# Patient Record
Sex: Female | Born: 1995 | Race: White | Hispanic: No | Marital: Single | Smoking: Former smoker
Health system: Southern US, Community
[De-identification: ages and names within clinical notes are randomized; demographics above are authoritative.]

## PROBLEM LIST (undated history)

## (undated) DIAGNOSIS — R87612 Low grade squamous intraepithelial lesion on cytologic smear of cervix (LGSIL): Secondary | ICD-10-CM

## (undated) DIAGNOSIS — E079 Disorder of thyroid, unspecified: Secondary | ICD-10-CM

## (undated) DIAGNOSIS — R42 Dizziness and giddiness: Secondary | ICD-10-CM

## (undated) DIAGNOSIS — F603 Borderline personality disorder: Secondary | ICD-10-CM

## (undated) HISTORY — DX: Low grade squamous intraepithelial lesion on cytologic smear of cervix (LGSIL): R87.612

## (undated) HISTORY — DX: Dizziness and giddiness: R42

## (undated) HISTORY — DX: Disorder of thyroid, unspecified: E07.9

---
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
## 2014-12-13 ENCOUNTER — Other Ambulatory Visit: Payer: Self-pay

## 2014-12-13 DIAGNOSIS — Z30019 Encounter for initial prescription of contraceptives, unspecified: Secondary | ICD-10-CM

## 2014-12-13 MED ORDER — ETONOGESTREL-ETHINYL ESTRADIOL 0.12-0.015 MG/24HR VA RING
VAGINAL_RING | VAGINAL | Status: DC
Start: 2014-12-13 — End: 2014-12-15

## 2014-12-15 ENCOUNTER — Other Ambulatory Visit: Payer: Self-pay

## 2014-12-15 DIAGNOSIS — Z30019 Encounter for initial prescription of contraceptives, unspecified: Secondary | ICD-10-CM

## 2014-12-15 MED ORDER — ETONOGESTREL-ETHINYL ESTRADIOL 0.12-0.015 MG/24HR VA RING: VAGINAL_RING | VAGINAL | Status: DC

## 2014-12-29 ENCOUNTER — Ambulatory Visit (INDEPENDENT_AMBULATORY_CARE_PROVIDER_SITE_OTHER): Payer: BLUE CROSS/BLUE SHIELD | Admitting: Women's Health

## 2014-12-29 ENCOUNTER — Encounter: Payer: Self-pay | Admitting: Women's Health

## 2014-12-29 VITALS — BP 115/80 | Ht 62.0 in | Wt 157.0 lb

## 2014-12-29 DIAGNOSIS — Z30019 Encounter for initial prescription of contraceptives, unspecified: Secondary | ICD-10-CM

## 2014-12-29 DIAGNOSIS — Z113 Encounter for screening for infections with a predominantly sexual mode of transmission: Secondary | ICD-10-CM | POA: Diagnosis not present

## 2014-12-29 LAB — URINALYSIS W MICROSCOPIC + REFLEX CULTURE
BILIRUBIN URINE: NEGATIVE
CASTS: NONE SEEN [LPF]
Crystals: NONE SEEN [HPF]
Glucose, UA: NEGATIVE
Ketones, ur: NEGATIVE
NITRITE: NEGATIVE
Protein, ur: NEGATIVE
YEAST: NONE SEEN [HPF]
pH: 6 (ref 5.0–8.0)

## 2014-12-29 MED ORDER — ETONOGESTREL-ETHINYL ESTRADIOL 0.12-0.015 MG/24HR VA RING: VAGINAL_RING | VAGINAL | Status: DC
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
## 2014-12-29 NOTE — Patient Instructions (Signed)

## 2014-12-30 LAB — GC/CHLAMYDIA PROBE AMP, URINE
CHLAMYDIA, SWAB/URINE, PCR: NEGATIVE
GC Probe Amp, Urine: NEGATIVE

## 2014-12-31 LAB — URINE CULTURE

## 2015-08-20 DIAGNOSIS — J029 Acute pharyngitis, unspecified: Secondary | ICD-10-CM | POA: Diagnosis not present

## 2016-01-03 ENCOUNTER — Encounter: Payer: BLUE CROSS/BLUE SHIELD | Admitting: Women's Health

## 2016-01-11 ENCOUNTER — Encounter: Payer: BLUE CROSS/BLUE SHIELD | Admitting: Women's Health

## 2016-02-07 ENCOUNTER — Other Ambulatory Visit: Payer: Self-pay

## 2016-02-07 DIAGNOSIS — Z30015 Encounter for initial prescription of vaginal ring hormonal contraceptive: Secondary | ICD-10-CM

## 2016-02-07 MED ORDER — ETONOGESTREL-ETHINYL ESTRADIOL 0.12-0.015 MG/24HR VA RING: VAGINAL_RING | VAGINAL | 0 refills | Status: DC

## 2016-03-06 ENCOUNTER — Encounter: Payer: BLUE CROSS/BLUE SHIELD | Admitting: Women's Health

## 2016-03-28 ENCOUNTER — Encounter: Payer: BLUE CROSS/BLUE SHIELD | Admitting: Women's Health

## 2016-05-06 ENCOUNTER — Other Ambulatory Visit: Payer: Self-pay

## 2016-05-06 DIAGNOSIS — Z30015 Encounter for initial prescription of vaginal ring hormonal contraceptive: Secondary | ICD-10-CM

## 2016-05-06 MED ORDER — ETONOGESTREL-ETHINYL ESTRADIOL 0.12-0.015 MG/24HR VA RING: VAGINAL_RING | VAGINAL | 0 refills | Status: DC

## 2016-05-17 ENCOUNTER — Encounter: Payer: BLUE CROSS/BLUE SHIELD | Admitting: Women's Health

## 2016-06-18 ENCOUNTER — Encounter: Payer: BLUE CROSS/BLUE SHIELD | Admitting: Women's Health

## 2016-06-25 ENCOUNTER — Ambulatory Visit (INDEPENDENT_AMBULATORY_CARE_PROVIDER_SITE_OTHER): Payer: BLUE CROSS/BLUE SHIELD | Admitting: Women's Health

## 2016-06-25 ENCOUNTER — Encounter: Payer: Self-pay | Admitting: Women's Health

## 2016-06-25 VITALS — BP 122/76 | Ht 62.0 in | Wt 176.0 lb

## 2016-06-25 DIAGNOSIS — Z01419 Encounter for gynecological examination (general) (routine) without abnormal findings: Secondary | ICD-10-CM

## 2016-06-25 DIAGNOSIS — Z30015 Encounter for initial prescription of vaginal ring hormonal contraceptive: Secondary | ICD-10-CM

## 2016-06-25 DIAGNOSIS — Z113 Encounter for screening for infections with a predominantly sexual mode of transmission: Secondary | ICD-10-CM

## 2016-06-25 LAB — URINALYSIS W MICROSCOPIC + REFLEX CULTURE
BILIRUBIN URINE: NEGATIVE
Casts: NONE SEEN [LPF]
Crystals: NONE SEEN [HPF]
Glucose, UA: NEGATIVE
KETONES UR: NEGATIVE
Nitrite: NEGATIVE
PH: 6 (ref 5.0–8.0)
Protein, ur: NEGATIVE
SPECIFIC GRAVITY, URINE: 1.025 (ref 1.001–1.035)
YEAST: NONE SEEN [HPF]

## 2016-06-25 MED ORDER — ETONOGESTREL-ETHINYL ESTRADIOL 0.12-0.015 MG/24HR VA RING: VAGINAL_RING | VAGINAL | 4 refills | Status: DC
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
## 2016-06-25 NOTE — Patient Instructions (Signed)
Living With Anxiety After being diagnosed with an anxiety disorder, you may be relieved to know why you have felt or behaved a certain way. It is natural to also feel overwhelmed about the treatment ahead and what it will mean for your life. With care and support, you can manage this condition and recover from it. How to cope with anxiety Dealing with stress  Stress is your body's reaction to life changes and events, both good and bad. Stress can last just a few hours or it can be ongoing. Stress can play a major role in anxiety, so it is important to learn both how to cope with stress and how to think about it differently. Talk with your health care provider or a counselor to learn more about stress reduction. He or she may suggest some stress reduction techniques, such as:  Music therapy. This can include creating or listening to music that you enjoy and that inspires you.  Mindfulness-based meditation. This involves being aware of your normal breaths, rather than trying to control your breathing. It can be done while sitting or walking.  Centering prayer. This is a kind of meditation that involves focusing on a word, phrase, or sacred image that is meaningful to you and that brings you peace.  Deep breathing. To do this, expand your stomach and inhale slowly through your nose. Hold your breath for 3-5 seconds. Then exhale slowly, allowing your stomach muscles to relax.  Self-talk. This is a skill where you identify thought patterns that lead to anxiety reactions and correct those thoughts.  Muscle relaxation. This involves tensing muscles then relaxing them. Choose a stress reduction technique that fits your lifestyle and personality. Stress reduction techniques take time and practice. Set aside 5-15 minutes a day to do them. Therapists can offer training in these techniques. The training may be covered by some insurance plans. Other things you can do to manage stress include:  Keeping a  stress diary. This can help you learn what triggers your stress and ways to control your response.  Thinking about how you respond to certain situations. You may not be able to control everything, but you can control your reaction.  Making time for activities that help you relax, and not feeling guilty about spending your time in this way. Therapy combined with coping and stress-reduction skills provides the best chance for successful treatment. Medicines  Medicines can help ease symptoms. Medicines for anxiety include:  Anti-anxiety drugs.  Antidepressants.  Beta-blockers. Medicines may be used as the main treatment for anxiety disorder, along with therapy, or if other treatments are not working. Medicines should be prescribed by a health care provider. Relationships  Relationships can play a big part in helping you recover. Try to spend more time connecting with trusted friends and family members. Consider going to couples counseling, taking family education classes, or going to family therapy. Therapy can help you and others better understand the condition. How to recognize changes in your condition Everyone has a different response to treatment for anxiety. Recovery from anxiety happens when symptoms decrease and stop interfering with your daily activities at home or work. This may mean that you will start to:  Have better concentration and focus.  Sleep better.  Be less irritable.  Have more energy.  Have improved memory. It is important to recognize when your condition is getting worse. Contact your health care provider if your symptoms interfere with home or work and you do not feel like your condition is  improving. Where to find help and support: You can get help and support from these sources:  Self-help groups.  Online and [employer].  A trusted spiritual leader.  Couples counseling.  Family education classes.  Family therapy. Follow these  instructions at home:  Eat a healthy diet that includes plenty of vegetables, fruits, whole grains, low-fat dairy products, and lean protein. Do not eat a lot of foods that are high in solid fats, added sugars, or salt.  Exercise. Most adults should do the following:  Exercise for at least 150 minutes each week. The exercise should increase your heart rate and make you sweat (moderate-intensity exercise).  Strengthening exercises at least twice a week.  Cut down on caffeine, tobacco, alcohol, and other potentially harmful substances.  Get the right amount and quality of sleep. Most adults need 7-9 hours of sleep each night.  Make choices that simplify your life.  Take over-the-counter and prescription medicines only as told by your health care provider.  Avoid caffeine, alcohol, and certain over-the-counter cold medicines. These may make you feel worse. Ask your pharmacist which medicines to avoid.  Keep all follow-up visits as told by your health care provider. This is important. Questions to ask your health care provider  Would I benefit from therapy?  How often should I follow up with a health care provider?  How long do I need to take medicine?  Are there any long-term side effects of my medicine?  Are there any alternatives to taking medicine? Contact a health care provider if:  You have a hard time staying focused or finishing daily tasks.  You spend many hours a day feeling worried about everyday life.  You become exhausted by worry.  You start to have headaches, feel tense, or have nausea.  You urinate more than normal.  You have diarrhea. Get help right away if:  You have a racing heart and shortness of breath.  You have thoughts of hurting yourself or others. If you ever feel like you may hurt yourself or others, or have thoughts about taking your own life, get help right away. You can go to your nearest emergency department or call:  Your local emergency  services (911 in the [place]  A suicide crisis helpline, such as the [facility] at (760)559-9699. This is open 24-hours a day. Summary  Taking steps to deal with stress can help calm you.  Medicines cannot cure anxiety disorders, but they can help ease symptoms.  Family, friends, and partners can play a big part in helping you recover from an anxiety disorder. This information is not intended to replace advice given to you by your health care provider. Make sure you discuss any questions you have with your health care provider. Document Released: 03/12/2016 Document Revised: 03/12/2016 Document Reviewed: 03/12/2016 Elsevier Interactive Patient Education  2017 [place] Maintenance, Female Adopting a healthy lifestyle and getting preventive care can go a long way to promote health and wellness. Talk with your health care provider about what schedule of regular examinations is right for you. This is a good chance for you to check in with your provider about disease prevention and staying healthy. In between checkups, there are plenty of things you can do on your own. Experts have done a lot of research about which lifestyle changes and preventive measures are most likely to keep you healthy. Ask your health care provider for more information. Weight and diet Eat a healthy diet  Be sure  to include plenty of vegetables, fruits, low-fat dairy products, and lean protein.  Do not eat a lot of foods high in solid fats, added sugars, or salt.  Get regular exercise. This is one of the most important things you can do for your health.  Most adults should exercise for at least 150 minutes each week. The exercise should increase your heart rate and make you sweat (moderate-intensity exercise).  Most adults should also do strengthening exercises at least twice a week. This is in addition to the moderate-intensity exercise. Maintain a healthy weight  Body mass  index (BMI) is a measurement that can be used to identify possible weight problems. It estimates body fat based on height and weight. Your health care provider can help determine your BMI and help you achieve or maintain a healthy weight.  For females 19 years of age and older:  A BMI below 18.5 is considered underweight.  A BMI of 18.5 to 24.9 is normal.  A BMI of 25 to 29.9 is considered overweight.  A BMI of 30 and above is considered obese. Watch levels of cholesterol and blood lipids  You should start having your blood tested for lipids and cholesterol at 22 years of age, then have this test every 5 years.  You may need to have your cholesterol levels checked more often if:  Your lipid or cholesterol levels are high.  You are older than 22 years of age.  You are at high risk for heart disease. Cancer screening Lung Cancer  Lung cancer screening is recommended for adults 64-36 years old who are at high risk for lung cancer because of a history of smoking.  A yearly low-dose CT scan of the lungs is recommended for people who:  Currently smoke.  Have quit within the past 15 years.  Have at least a 30-pack-year history of smoking. A pack year is smoking an average of one pack of cigarettes a day for 1 year.  Yearly screening should continue until it has been 15 years since you quit.  Yearly screening should stop if you develop a health problem that would prevent you from having lung cancer treatment. Breast Cancer  Practice breast self-awareness. This means understanding how your breasts normally appear and feel.  It also means doing regular breast self-exams. Let your health care provider know about any changes, no matter how small.  If you are in your 20s or 30s, you should have a clinical breast exam (CBE) by a health care provider every 1-3 years as part of a regular health exam.  If you are 75 or older, have a CBE every year. Also consider having a breast X-ray  (mammogram) every year.  If you have a family history of breast cancer, talk to your health care provider about genetic screening.  If you are at high risk for breast cancer, talk to your health care provider about having an MRI and a mammogram every year.  Breast cancer gene (BRCA) assessment is recommended for women who have family members with BRCA-related cancers. BRCA-related cancers include:  Breast.  Ovarian.  Tubal.  Peritoneal cancers.  Results of the assessment will determine the need for genetic counseling and BRCA1 and BRCA2 testing. Cervical Cancer  Your health care provider may recommend that you be screened regularly for cancer of the pelvic organs (ovaries, uterus, and vagina). This screening involves a pelvic examination, including checking for microscopic changes to the surface of your cervix (Pap test). You may be encouraged to  have this screening done every 3 years, beginning at age 50.  For women ages 24-65, health care providers may recommend pelvic exams and Pap testing every 3 years, or they may recommend the Pap and pelvic exam, combined with testing for human papilloma virus (HPV), every 5 years. Some types of HPV increase your risk of cervical cancer. Testing for HPV may also be done on women of any age with unclear Pap test results.  Other health care providers may not recommend any screening for nonpregnant women who are considered low risk for pelvic cancer and who do not have symptoms. Ask your health care provider if a screening pelvic exam is right for you.  If you have had past treatment for cervical cancer or a condition that could lead to cancer, you need Pap tests and screening for cancer for at least 20 years after your treatment. If Pap tests have been discontinued, your risk factors (such as having a new sexual partner) need to be reassessed to determine if screening should resume. Some women have medical problems that increase the chance of getting  cervical cancer. In these cases, your health care provider may recommend more frequent screening and Pap tests. Colorectal Cancer  This type of cancer can be detected and often prevented.  Routine colorectal cancer screening usually begins at 22 years of age and continues through 22 years of age.  Your health care provider may recommend screening at an earlier age if you have risk factors for colon cancer.  Your health care provider may also recommend using home test kits to check for hidden blood in the stool.  A small camera at the end of a tube can be used to examine your colon directly (sigmoidoscopy or colonoscopy). This is done to check for the earliest forms of colorectal cancer.  Routine screening usually begins at age 78.  Direct examination of the colon should be repeated every 5-10 years through 22 years of age. However, you may need to be screened more often if early forms of precancerous polyps or small growths are found. Skin Cancer  Check your skin from head to toe regularly.  Tell your health care provider about any new moles or changes in moles, especially if there is a change in a mole's shape or color.  Also tell your health care provider if you have a mole that is larger than the size of a pencil eraser.  Always use sunscreen. Apply sunscreen liberally and repeatedly throughout the day.  Protect yourself by wearing long sleeves, pants, a wide-brimmed hat, and sunglasses whenever you are outside. Heart disease, diabetes, and high blood pressure  High blood pressure causes heart disease and increases the risk of stroke. High blood pressure is more likely to develop in:  People who have blood pressure in the high end of the normal range (130-139/85-89 mm Hg).  People who are overweight or obese.  People who are African American.  If you are 20-25 years of age, have your blood pressure checked every 3-5 years. If you are 44 years of age or older, have your blood  pressure checked every year. You should have your blood pressure measured twice-once when you are at a hospital or clinic, and once when you are not at a hospital or clinic. Record the average of the two measurements. To check your blood pressure when you are not at a hospital or clinic, you can use:  An automated blood pressure machine at a pharmacy.  A home blood  pressure monitor.  If you are between 72 years and 68 years old, ask your health care provider if you should take aspirin to prevent strokes.  Have regular diabetes screenings. This involves taking a blood sample to check your fasting blood sugar level.  If you are at a normal weight and have a low risk for diabetes, have this test once every three years after 22 years of age.  If you are overweight and have a high risk for diabetes, consider being tested at a younger age or more often. Preventing infection Hepatitis B  If you have a higher risk for hepatitis B, you should be screened for this virus. You are considered at high risk for hepatitis B if:  You were born in a country where hepatitis B is common. Ask your health care provider which countries are considered high risk.  Your parents were born in a high-risk country, and you have not been immunized against hepatitis B (hepatitis B vaccine).  You have HIV or AIDS.  You use needles to inject street drugs.  You live with someone who has hepatitis B.  You have had sex with someone who has hepatitis B.  You get hemodialysis treatment.  You take certain medicines for conditions, including cancer, organ transplantation, and autoimmune conditions. Hepatitis C  Blood testing is recommended for:  Everyone born from [birth year] through 1966.  Anyone with known risk factors for hepatitis C. Sexually transmitted infections (STIs)  You should be screened for sexually transmitted infections (STIs) including gonorrhea and chlamydia if:  You are sexually active and are younger  than 22 years of age.  You are older than 22 years of age and your health care provider tells you that you are at risk for this type of infection.  Your sexual activity has changed since you were last screened and you are at an increased risk for chlamydia or gonorrhea. Ask your health care provider if you are at risk.  If you do not have HIV, but are at risk, it may be recommended that you take a prescription medicine daily to prevent HIV infection. This is called pre-exposure prophylaxis (PrEP). You are considered at risk if:  You are sexually active and do not regularly use condoms or know the HIV status of your partner(s).  You take drugs by injection.  You are sexually active with a partner who has HIV. Talk with your health care provider about whether you are at high risk of being infected with HIV. If you choose to begin PrEP, you should first be tested for HIV. You should then be tested every 3 months for as long as you are taking PrEP. Pregnancy  If you are premenopausal and you may become pregnant, ask your health care provider about preconception counseling.  If you may become pregnant, take 400 to 800 micrograms (mcg) of folic acid every day.  If you want to prevent pregnancy, talk to your health care provider about birth control (contraception). Osteoporosis and menopause  Osteoporosis is a disease in which the bones lose minerals and strength with aging. This can result in serious bone fractures. Your risk for osteoporosis can be identified using a bone density scan.  If you are 1 years of age or older, or if you are at risk for osteoporosis and fractures, ask your health care provider if you should be screened.  Ask your health care provider whether you should take a calcium or vitamin D supplement to lower your risk for osteoporosis.  Menopause may have certain physical symptoms and risks.  Hormone replacement therapy may reduce some of these symptoms and risks. Talk  to your health care provider about whether hormone replacement therapy is right for you. Follow these instructions at home:  Schedule regular health, dental, and eye exams.  Stay current with your immunizations.  Do not use any tobacco products including cigarettes, chewing tobacco, or electronic cigarettes.  If you are pregnant, do not drink alcohol.  If you are breastfeeding, limit how much and how often you drink alcohol.  Limit alcohol intake to no more than 1 drink per day for nonpregnant women. One drink equals 12 ounces of beer, 5 ounces of wine, or 1 ounces of hard liquor.  Do not use street drugs.  Do not share needles.  Ask your health care provider for help if you need support or information about quitting drugs.  Tell your health care provider if you often feel depressed.  Tell your health care provider if you have ever been abused or do not feel safe at home. This information is not intended to replace advice given to you by your health care provider. Make sure you discuss any questions you have with your health care provider. Document Released: 10/01/2010 Document Revised: 08/24/2015 Document Reviewed: 12/20/2014 Elsevier Interactive Patient Education  2017 [employer].

## 2016-06-26 LAB — GC/CHLAMYDIA PROBE AMP
CT Probe RNA: NOT DETECTED
GC PROBE AMP APTIMA: NOT DETECTED

## 2016-06-26 LAB — URINE CULTURE

## 2016-07-18 ENCOUNTER — Other Ambulatory Visit: Payer: Self-pay

## 2016-07-18 DIAGNOSIS — Z30015 Encounter for initial prescription of vaginal ring hormonal contraceptive: Secondary | ICD-10-CM

## 2016-07-18 MED ORDER — ETONOGESTREL-ETHINYL ESTRADIOL 0.12-0.015 MG/24HR VA RING: VAGINAL_RING | VAGINAL | 4 refills | Status: DC

## 2017-01-28 ENCOUNTER — Other Ambulatory Visit: Payer: Self-pay

## 2017-01-28 DIAGNOSIS — Z30015 Encounter for initial prescription of vaginal ring hormonal contraceptive: Secondary | ICD-10-CM

## 2017-01-28 MED ORDER — ETONOGESTREL-ETHINYL ESTRADIOL 0.12-0.015 MG/24HR VA RING: VAGINAL_RING | VAGINAL | 6 refills | Status: DC

## 2017-01-28 NOTE — Telephone Encounter (Signed)
Note from pharmacy received saying they need new Rx for patients Nuvaring as she is switching from mail order. Rx sent.

## 2017-03-18 DIAGNOSIS — B2790 Infectious mononucleosis, unspecified without complication: Secondary | ICD-10-CM | POA: Diagnosis not present

## 2017-03-18 DIAGNOSIS — J029 Acute pharyngitis, unspecified: Secondary | ICD-10-CM | POA: Diagnosis not present

## 2017-03-18 DIAGNOSIS — Z20828 Contact with and (suspected) exposure to other viral communicable diseases: Secondary | ICD-10-CM | POA: Diagnosis not present

## 2017-03-21 DIAGNOSIS — B085 Enteroviral vesicular pharyngitis: Secondary | ICD-10-CM | POA: Diagnosis not present

## 2017-04-09 DIAGNOSIS — F332 Major depressive disorder, recurrent severe without psychotic features: Secondary | ICD-10-CM | POA: Diagnosis not present

## 2017-04-16 DIAGNOSIS — F332 Major depressive disorder, recurrent severe without psychotic features: Secondary | ICD-10-CM | POA: Diagnosis not present

## 2017-05-07 DIAGNOSIS — F332 Major depressive disorder, recurrent severe without psychotic features: Secondary | ICD-10-CM | POA: Diagnosis not present

## 2017-05-28 DIAGNOSIS — F332 Major depressive disorder, recurrent severe without psychotic features: Secondary | ICD-10-CM | POA: Diagnosis not present

## 2017-05-29 DIAGNOSIS — J069 Acute upper respiratory infection, unspecified: Secondary | ICD-10-CM | POA: Diagnosis not present

## 2017-05-29 DIAGNOSIS — J209 Acute bronchitis, unspecified: Secondary | ICD-10-CM | POA: Diagnosis not present

## 2017-05-29 DIAGNOSIS — F332 Major depressive disorder, recurrent severe without psychotic features: Secondary | ICD-10-CM | POA: Diagnosis not present

## 2017-06-04 DIAGNOSIS — F332 Major depressive disorder, recurrent severe without psychotic features: Secondary | ICD-10-CM | POA: Diagnosis not present

## 2017-06-11 DIAGNOSIS — F332 Major depressive disorder, recurrent severe without psychotic features: Secondary | ICD-10-CM | POA: Diagnosis not present

## 2017-06-18 DIAGNOSIS — F332 Major depressive disorder, recurrent severe without psychotic features: Secondary | ICD-10-CM | POA: Diagnosis not present

## 2017-07-23 DIAGNOSIS — F332 Major depressive disorder, recurrent severe without psychotic features: Secondary | ICD-10-CM | POA: Diagnosis not present

## 2017-08-11 ENCOUNTER — Other Ambulatory Visit: Payer: Self-pay | Admitting: *Deleted

## 2017-08-11 DIAGNOSIS — Z30015 Encounter for initial prescription of vaginal ring hormonal contraceptive: Secondary | ICD-10-CM

## 2017-08-27 DIAGNOSIS — F332 Major depressive disorder, recurrent severe without psychotic features: Secondary | ICD-10-CM | POA: Diagnosis not present

## 2017-09-03 DIAGNOSIS — F332 Major depressive disorder, recurrent severe without psychotic features: Secondary | ICD-10-CM | POA: Diagnosis not present

## 2017-10-22 DIAGNOSIS — F332 Major depressive disorder, recurrent severe without psychotic features: Secondary | ICD-10-CM | POA: Diagnosis not present

## 2017-12-03 DIAGNOSIS — F332 Major depressive disorder, recurrent severe without psychotic features: Secondary | ICD-10-CM | POA: Diagnosis not present

## 2017-12-10 DIAGNOSIS — F332 Major depressive disorder, recurrent severe without psychotic features: Secondary | ICD-10-CM | POA: Diagnosis not present

## 2017-12-24 DIAGNOSIS — F332 Major depressive disorder, recurrent severe without psychotic features: Secondary | ICD-10-CM | POA: Diagnosis not present

## 2017-12-31 DIAGNOSIS — F332 Major depressive disorder, recurrent severe without psychotic features: Secondary | ICD-10-CM | POA: Diagnosis not present

## 2018-01-07 DIAGNOSIS — F332 Major depressive disorder, recurrent severe without psychotic features: Secondary | ICD-10-CM | POA: Diagnosis not present

## 2018-01-14 DIAGNOSIS — F332 Major depressive disorder, recurrent severe without psychotic features: Secondary | ICD-10-CM | POA: Diagnosis not present

## 2018-01-28 DIAGNOSIS — F332 Major depressive disorder, recurrent severe without psychotic features: Secondary | ICD-10-CM | POA: Diagnosis not present

## 2018-02-13 DIAGNOSIS — R300 Dysuria: Secondary | ICD-10-CM | POA: Diagnosis not present

## 2018-02-17 DIAGNOSIS — F332 Major depressive disorder, recurrent severe without psychotic features: Secondary | ICD-10-CM | POA: Diagnosis not present

## 2018-02-25 DIAGNOSIS — F332 Major depressive disorder, recurrent severe without psychotic features: Secondary | ICD-10-CM | POA: Diagnosis not present

## 2018-03-11 DIAGNOSIS — F332 Major depressive disorder, recurrent severe without psychotic features: Secondary | ICD-10-CM | POA: Diagnosis not present

## 2018-03-20 ENCOUNTER — Other Ambulatory Visit: Payer: Self-pay | Admitting: Women's Health

## 2018-03-20 DIAGNOSIS — Z30015 Encounter for initial prescription of vaginal ring hormonal contraceptive: Secondary | ICD-10-CM

## 2018-03-23 ENCOUNTER — Telehealth: Payer: Self-pay

## 2018-03-23 ENCOUNTER — Other Ambulatory Visit: Payer: Self-pay | Admitting: Women's Health

## 2018-03-23 DIAGNOSIS — Z30015 Encounter for initial prescription of vaginal ring hormonal contraceptive: Secondary | ICD-10-CM

## 2018-03-23 NOTE — Telephone Encounter (Signed)
Very overdue for CE with last one 06/25/2016.  [facility]    You are back up MD. Rip Harbourk to refill?

## 2018-03-23 NOTE — Telephone Encounter (Signed)
Spoke with patient and advised this. Rx refill sent.

## 2018-03-23 NOTE — Telephone Encounter (Signed)
Okay to refill through 05/06/2018.  Patient needs to understand if she misses that appointment no refills

## 2018-04-08 DIAGNOSIS — F332 Major depressive disorder, recurrent severe without psychotic features: Secondary | ICD-10-CM | POA: Diagnosis not present

## 2018-04-09 ENCOUNTER — Encounter (HOSPITAL_COMMUNITY): Payer: Self-pay | Admitting: Emergency Medicine

## 2018-04-09 ENCOUNTER — Emergency Department (HOSPITAL_COMMUNITY)
Admission: EM | Admit: 2018-04-09 | Discharge: 2018-04-10 | Disposition: A | Discharge status: Home / Self Care | Payer: BLUE CROSS/BLUE SHIELD | Attending: Emergency Medicine | Admitting: Emergency Medicine

## 2018-04-09 ENCOUNTER — Other Ambulatory Visit: Payer: Self-pay

## 2018-04-09 DIAGNOSIS — M545 Low back pain, unspecified: Secondary | ICD-10-CM

## 2018-04-09 DIAGNOSIS — F17200 Nicotine dependence, unspecified, uncomplicated: Secondary | ICD-10-CM | POA: Diagnosis not present

## 2018-04-09 DIAGNOSIS — Z79899 Other long term (current) drug therapy: Secondary | ICD-10-CM | POA: Diagnosis not present

## 2018-04-09 DIAGNOSIS — R109 Unspecified abdominal pain: Secondary | ICD-10-CM | POA: Diagnosis not present

## 2018-04-09 HISTORY — DX: Borderline personality disorder: F60.3

## 2018-04-09 LAB — URINALYSIS, ROUTINE W REFLEX MICROSCOPIC
Bilirubin Urine: NEGATIVE
Glucose, UA: NEGATIVE mg/dL
Hgb urine dipstick: NEGATIVE
KETONES UR: NEGATIVE mg/dL
Nitrite: NEGATIVE
Protein, ur: NEGATIVE mg/dL
Specific Gravity, Urine: 1.02 (ref 1.005–1.030)
pH: 6 (ref 5.0–8.0)

## 2018-04-09 LAB — POC URINE PREG, ED: PREG TEST UR: NEGATIVE

## 2018-04-09 NOTE — ED Triage Notes (Signed)
Patient c/o left side flank pain today. Recently finished abx treating pyelo. Denies other urinary sx. Denies N/V.

## 2018-04-10 ENCOUNTER — Emergency Department (HOSPITAL_COMMUNITY): Payer: BLUE CROSS/BLUE SHIELD

## 2018-04-10 DIAGNOSIS — R109 Unspecified abdominal pain: Secondary | ICD-10-CM | POA: Diagnosis not present

## 2018-04-10 MED ORDER — IBUPROFEN 600 MG PO TABS: 600.0000 mg | ORAL_TABLET | Freq: Four times a day (QID) | ORAL | 0 refills | Status: AC | PRN

## 2018-04-10 MED ORDER — OXYCODONE-ACETAMINOPHEN 5-325 MG PO TABS
1.0000 | ORAL_TABLET | Freq: Once | ORAL | Status: AC
  Administered 2018-04-10: 1 via ORAL
  Filled 2018-04-10: qty 1

## 2018-04-10 MED ORDER — CYCLOBENZAPRINE HCL 10 MG PO TABS: 10.0000 mg | ORAL_TABLET | Freq: Two times a day (BID) | ORAL | 0 refills | Status: DC | PRN

## 2018-04-10 MED ORDER — SODIUM CHLORIDE 0.9 % IV BOLUS: 1000.0000 mL | Freq: Once | INTRAVENOUS | Status: DC

## 2018-04-10 NOTE — ED Notes (Signed)
Patient transported to CT 

## 2018-04-10 NOTE — ED Provider Notes (Signed)
[facility]-EMERGENCY DEPT Provider Note   CSN: [id number] Arrival date & time: 04/09/18  2015     History   Chief Complaint Chief Complaint  Patient presents with  . Flank Pain    HPI Nina [name] is a 23 y.o. female.  Patient presents to ED with complaint of left flank pain that started 2-3 days ago. No fever, urinary symptoms (hematuria, dysuria), nausea or vomiting. The pain is constant with periodic spikes in intensity. She states that passing gas tends to relieve the pain but no other identified modifying factors. No abdominal pain, constipation, diarrhea.   The history is provided by the patient. No language interpreter was used.  Flank Pain  Pertinent negatives include no abdominal pain and no shortness of breath.    Past Medical History:  Diagnosis Date  . Borderline personality disorder (HCC)   . Thyroid disease   . Vertigo     Patient Active Problem List   Diagnosis Date Noted  . History of depression 12/07/2012    History reviewed. No pertinent surgical history.   OB History    Gravida  0   Para      Term      Preterm      AB      Living        SAB      TAB      Ectopic      Multiple      Live Births               Home Medications    Prior to Admission medications   Medication Sig Start Date End Date Taking? Authorizing Provider  amphetamine-dextroamphetamine (ADDERALL) 20 MG tablet Take 20 mg by mouth 2 (two) times daily.   Yes [provider]  divalproex (DEPAKOTE) 250 MG DR tablet Take 250 mg by mouth 2 (two) times daily.   Yes [provider]  escitalopram (LEXAPRO) 20 MG tablet Take 20 mg by mouth 2 (two) times daily.   Yes [provider]  etonogestrel-ethinyl estradiol (NUVARING) 0.12-0.015 MG/24HR vaginal ring INSERT 1 RING VAGINALLY AS DIRECTED. REMOVE AFTER 3 WEEKS & WAIT 7 DAYS BEFORE INSERTING A NEW RING Patient not taking: Reported on 04/10/2018 03/23/18   Fontaine,  Nadyne Coombes, MD    Family History Family History  Problem Relation Age of Onset  . Hypertension Paternal Grandmother   . Hypertension Paternal Grandfather     Social History Social History   Tobacco Use  . Smoking status: Current Some Day Smoker  . Smokeless tobacco: Never Used  Substance Use Topics  . Alcohol use: Yes    Alcohol/week: 0.0 standard drinks    Comment: occassional  . Drug use: Yes    Types: Marijuana    Comment: previous use     Allergies   Patient has no known allergies.   Review of Systems Review of Systems  Constitutional: Negative for chills and fever.  HENT: Negative.   Respiratory: Negative.  Negative for shortness of breath.   Cardiovascular: Negative.   Gastrointestinal: Negative.  Negative for abdominal pain, constipation, diarrhea and vomiting.  Genitourinary: Positive for flank pain. Negative for dysuria and hematuria.  Skin: Negative.   Neurological: Negative.      Physical Exam Updated Vital Signs BP 124/83 (BP Location: Left Arm)   Pulse 92   Temp 98.6 F (37 C) (Oral)   Resp 18   SpO2 100%   Physical Exam Vitals signs and nursing  note reviewed.  Constitutional:      Appearance: She is well-developed.     Comments: Uncomfortable in appearance.  HENT:     Head: Normocephalic.  Neck:     Musculoskeletal: Normal range of motion and neck supple.  Cardiovascular:     Rate and Rhythm: Normal rate and regular rhythm.  Pulmonary:     Effort: Pulmonary effort is normal.     Breath sounds: Normal breath sounds.  Abdominal:     General: Bowel sounds are normal.     Palpations: Abdomen is soft.     Tenderness: There is no abdominal tenderness. There is no guarding or rebound.  Musculoskeletal: Normal range of motion.       Back:     Comments: Tender to palpation  Skin:    General: Skin is warm and dry.     Findings: No rash.  Neurological:     Mental Status: She is alert and oriented to person, place, and time.      ED  Treatments / Results  Labs (all labs ordered are listed, but only abnormal results are displayed) Labs Reviewed  URINALYSIS, ROUTINE W REFLEX MICROSCOPIC - Abnormal; Notable for the following components:      Result Value   Leukocytes, UA TRACE (*)    Bacteria, UA RARE (*)    All other components within normal limits  POC URINE PREG, ED   Results for orders placed or performed during the hospital encounter of 04/09/18  Urinalysis, Routine w reflex microscopic- may I&O cath if menses  Result Value Ref Range   Color, Urine YELLOW YELLOW   APPearance CLEAR CLEAR   Specific Gravity, Urine 1.020 1.005 - 1.030   pH 6.0 5.0 - 8.0   Glucose, UA NEGATIVE NEGATIVE mg/dL   Hgb urine dipstick NEGATIVE NEGATIVE   Bilirubin Urine NEGATIVE NEGATIVE   Ketones, ur NEGATIVE NEGATIVE mg/dL   Protein, ur NEGATIVE NEGATIVE mg/dL   Nitrite NEGATIVE NEGATIVE   Leukocytes, UA TRACE (A) NEGATIVE   RBC / HPF 0-5 0 - 5 RBC/hpf   WBC, UA 0-5 0 - 5 WBC/hpf   Bacteria, UA RARE (A) NONE SEEN   Squamous Epithelial / LPF 0-5 0 - 5  POC urine preg, ED  Result Value Ref Range   Preg Test, Ur NEGATIVE NEGATIVE     EKG None  Radiology No results found.  Procedures Procedures (including critical care time)  Medications Ordered in ED Medications  oxyCODONE-acetaminophen (PERCOCET/ROXICET) 5-325 MG per tablet 1 tablet (has no administration in time range)     Initial Impression / Assessment and Plan / ED Course  I have reviewed the triage vital signs and the nursing notes.  Pertinent labs & imaging results that were available during my care of the patient were reviewed by me and considered in my medical decision making (see chart for details).     Patient to ED with left flank pain x 2-3 days. No fever, vomiting, diarrhea or constipation.   The patient was initially tachycardic to 120's. CBC, CMET ordered for further evaluation but the patient is uncomfortable with IV sticks and refused. CT renal  ordered to evaluate for stone. Discussed that, if negative, identification of the source of her pain would be difficult without blood studies.   Tachycardia significantly improved on recheck. Pain addressed. CT pending.  CT negative for acute findings. The patient is sleeping on re-exam. VSS. UA negative for infection. Most likely cause of pain is musculoskeletal. Will treat with  anti-inflammatories and muscle relaxer.   Final Clinical Impressions(s) / ED Diagnoses   Final diagnoses:  None   1. Back pain  ED Discharge Orders    None       Elpidio AnisUpstill, Jyoti Harju, PA-C 04/10/18 0358    Paula LibraMolpus, John, MD 04/10/18 [phone]

## 2018-04-10 NOTE — ED Notes (Signed)
Patient refused IV and blood work. EDP made aware of refusal.

## 2018-04-10 NOTE — ED Notes (Signed)
Pt refused lab draw from this NT

## 2018-04-10 NOTE — Discharge Instructions (Addendum)
Use warm compresses for pain. Take medications as prescribed. Follow up with your doctor if pain persists. Return here with worsening symptoms or new concern.

## 2018-04-15 DIAGNOSIS — F332 Major depressive disorder, recurrent severe without psychotic features: Secondary | ICD-10-CM | POA: Diagnosis not present

## 2018-04-22 DIAGNOSIS — F332 Major depressive disorder, recurrent severe without psychotic features: Secondary | ICD-10-CM | POA: Diagnosis not present

## 2018-04-29 DIAGNOSIS — F332 Major depressive disorder, recurrent severe without psychotic features: Secondary | ICD-10-CM | POA: Diagnosis not present

## 2018-05-02 DIAGNOSIS — R87612 Low grade squamous intraepithelial lesion on cytologic smear of cervix (LGSIL): Secondary | ICD-10-CM

## 2018-05-02 HISTORY — DX: Low grade squamous intraepithelial lesion on cytologic smear of cervix (LGSIL): R87.612

## 2018-05-06 ENCOUNTER — Encounter: Payer: Self-pay | Admitting: Women's Health

## 2018-05-06 ENCOUNTER — Ambulatory Visit: Payer: BLUE CROSS/BLUE SHIELD | Admitting: Women's Health

## 2018-05-06 VITALS — BP 124/78 | Ht 62.0 in | Wt 166.0 lb

## 2018-05-06 DIAGNOSIS — Z3044 Encounter for surveillance of vaginal ring hormonal contraceptive device: Secondary | ICD-10-CM | POA: Diagnosis not present

## 2018-05-06 DIAGNOSIS — Z113 Encounter for screening for infections with a predominantly sexual mode of transmission: Secondary | ICD-10-CM

## 2018-05-06 DIAGNOSIS — N760 Acute vaginitis: Secondary | ICD-10-CM

## 2018-05-06 DIAGNOSIS — Z01419 Encounter for gynecological examination (general) (routine) without abnormal findings: Secondary | ICD-10-CM | POA: Diagnosis not present

## 2018-05-06 DIAGNOSIS — Z8639 Personal history of other endocrine, nutritional and metabolic disease: Secondary | ICD-10-CM | POA: Diagnosis not present

## 2018-05-06 DIAGNOSIS — B9689 Other specified bacterial agents as the cause of diseases classified elsewhere: Secondary | ICD-10-CM

## 2018-05-06 DIAGNOSIS — N898 Other specified noninflammatory disorders of vagina: Secondary | ICD-10-CM | POA: Diagnosis not present

## 2018-05-06 DIAGNOSIS — R87610 Atypical squamous cells of undetermined significance on cytologic smear of cervix (ASC-US): Secondary | ICD-10-CM | POA: Diagnosis not present

## 2018-05-06 DIAGNOSIS — Z13220 Encounter for screening for lipoid disorders: Secondary | ICD-10-CM

## 2018-05-06 LAB — WET PREP FOR TRICH, YEAST, CLUE

## 2018-05-06 MED ORDER — METRONIDAZOLE 500 MG PO TABS: 500.0000 mg | ORAL_TABLET | Freq: Two times a day (BID) | ORAL | 0 refills | Status: DC

## 2018-05-06 MED ORDER — ETONOGESTREL-ETHINYL ESTRADIOL 0.12-0.015 MG/24HR VA RING: VAGINAL_RING | VAGINAL | 4 refills | Status: DC

## 2018-05-06 NOTE — Patient Instructions (Signed)
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
## 2018-05-07 LAB — C. TRACHOMATIS/N. GONORRHOEAE RNA
C. trachomatis RNA, TMA: NOT DETECTED
N. gonorrhoeae RNA, TMA: NOT DETECTED

## 2018-05-07 LAB — CBC WITH DIFFERENTIAL/PLATELET
Absolute Monocytes: 752 cells/uL (ref 200–950)
BASOS PCT: 0.2 %
Basophils Absolute: 8 cells/uL (ref 0–200)
Eosinophils Absolute: 21 cells/uL (ref 15–500)
Eosinophils Relative: 0.5 %
HCT: 41.5 % (ref 35.0–45.0)
Hemoglobin: 14.2 g/dL (ref 11.7–15.5)
Lymphs Abs: 1071 cells/uL (ref 850–3900)
MCH: 27.7 pg (ref 27.0–33.0)
MCHC: 34.2 g/dL (ref 32.0–36.0)
MCV: 81.1 fL (ref 80.0–100.0)
MPV: 11.3 fL (ref 7.5–12.5)
Monocytes Relative: 17.9 %
Neutro Abs: 2348 cells/uL (ref 1500–7800)
Neutrophils Relative %: 55.9 %
Platelets: 231 10*3/uL (ref 140–400)
RBC: 5.12 10*6/uL — ABNORMAL HIGH (ref 3.80–5.10)
RDW: 12.6 % (ref 11.0–15.0)
Total Lymphocyte: 25.5 %
WBC: 4.2 10*3/uL (ref 3.8–10.8)

## 2018-05-07 LAB — HEPATITIS B SURFACE ANTIGEN: Hepatitis B Surface Ag: NONREACTIVE

## 2018-05-07 LAB — RPR: RPR Ser Ql: NONREACTIVE

## 2018-05-07 LAB — TSH: TSH: 0.01 mIU/L — ABNORMAL LOW

## 2018-05-07 LAB — HIV ANTIBODY (ROUTINE TESTING W REFLEX): HIV 1&2 Ab, 4th Generation: NONREACTIVE

## 2018-05-07 LAB — HEPATITIS C ANTIBODY
Hepatitis C Ab: NONREACTIVE
SIGNAL TO CUT-OFF: 0.02 (ref <1.00)

## 2018-05-11 ENCOUNTER — Telehealth: Payer: Self-pay | Admitting: *Deleted

## 2018-05-11 DIAGNOSIS — E0590 Thyrotoxicosis, unspecified without thyrotoxic crisis or storm: Secondary | ICD-10-CM

## 2018-05-11 LAB — PAP IG W/ RFLX HPV ASCU

## 2018-05-11 LAB — HUMAN PAPILLOMAVIRUS, HIGH RISK: HPV DNA High Risk: DETECTED — AB

## 2018-05-11 NOTE — Telephone Encounter (Signed)
"  Per [place] TSH low which is hyperthyroidism. "  Referral placed at [facility] they will call to schedule.

## 2018-05-18 NOTE — Telephone Encounter (Signed)
patient scheduled on 05/20/18 @ 1:40pm

## 2018-05-19 DIAGNOSIS — F332 Major depressive disorder, recurrent severe without psychotic features: Secondary | ICD-10-CM | POA: Diagnosis not present

## 2018-05-20 ENCOUNTER — Ambulatory Visit: Payer: BLUE CROSS/BLUE SHIELD | Admitting: Internal Medicine

## 2018-05-20 ENCOUNTER — Encounter: Payer: Self-pay | Admitting: Internal Medicine

## 2018-05-20 VITALS — BP 124/88 | HR 110 | Resp 16 | Ht 62.0 in | Wt 166.0 lb

## 2018-05-20 DIAGNOSIS — E0590 Thyrotoxicosis, unspecified without thyrotoxic crisis or storm: Secondary | ICD-10-CM | POA: Diagnosis not present

## 2018-05-20 DIAGNOSIS — F332 Major depressive disorder, recurrent severe without psychotic features: Secondary | ICD-10-CM | POA: Diagnosis not present

## 2018-05-20 LAB — TSH: TSH: <0.01 u[IU]/mL — ABNORMAL LOW (ref 0.35–4.50)

## 2018-05-20 LAB — T4, FREE: Free T4: 2.58 ng/dL — ABNORMAL HIGH (ref 0.60–1.60)

## 2018-05-20 MED ORDER — ATENOLOL 25 MG PO TABS: 25.0000 mg | ORAL_TABLET | Freq: Every day | ORAL | 3 refills | Status: DC

## 2018-05-20 NOTE — Patient Instructions (Signed)
-   Please stop by the lab today , we will contact you with results - Please start Atenolol 25 mg daily,this is to slow down your heart.

## 2018-05-20 NOTE — Progress Notes (Signed)
Name: Nina [name]  MRN/ DOB: [id number], 06-Dec-1995    Age/ Sex: 23 y.o., female    PCP: Danie Binder   Reason for Endocrinology Evaluation: Hyperthyroidism     Date of Initial Endocrinology Evaluation: 05/20/2018     HPI: [name] is a 23 y.o. female with a past medical history of Graves' Disease, depression , borderline personality disorder, history of excessive alcohol intake and anxiety . The patient presented for initial endocrinology clinic visit on 05/20/2018 for consultative assistance with her hyperthyroidism.   She was diagnosed with Graves' Disease at the age of 34 , she was put on a B-blocker and something else that she took for 6 months and was lost to follow up until she went to have a routine check up during her Gyn visit in February, 2020 and was found to have a TSH of 0.01 uIU/mL    Takes Kelp , and vitamins.  Last time she used Kelp was 4 weeks ago.  She has lost 35 lbs in the past 5 month , she endorses Heat intolerance, diarrhea, jittery and anxiety. Denies palpitations, or local neck symptoms.  She stopped excessive daily alcohol drinking approximately a year ago, currently she drinks 1-2 drinks once or twice a week, she works at a bar.   She denies any local neck symptoms  Maternal and paternal grandmothers with thyroid disease    HISTORY:  Past Medical History:  Past Medical History:  Diagnosis Date  . Borderline personality disorder (HCC)   . Thyroid disease   . Vertigo     Past Surgical History: History reviewed. No pertinent surgical history.   Social History:  reports that she has been smoking. She has never used smokeless tobacco. She reports current alcohol use. She reports current drug use. Drug: Marijuana.  Family History: family history includes Hypertension in her paternal grandfather and paternal grandmother; Thyroid disease in her maternal grandmother and paternal grandmother.   HOME MEDICATIONS: Allergies as  of 05/20/2018      Reactions   Azithromycin Rash      Medication List       Accurate as of May 20, 2018  3:57 PM. Always use your most recent med list.        amphetamine-dextroamphetamine 20 MG tablet Commonly known as:  ADDERALL Take 20 mg by mouth 2 (two) times daily.   atenolol 25 MG tablet Commonly known as:  TENORMIN Take 1 tablet (25 mg total) by mouth daily.   clonazePAM 0.5 MG tablet Commonly known as:  KLONOPIN 0.5 mg.   divalproex 250 MG DR tablet Commonly known as:  DEPAKOTE Take 250 mg by mouth 2 (two) times daily.   escitalopram 20 MG tablet Commonly known as:  LEXAPRO Take 20 mg by mouth 2 (two) times daily.   etonogestrel-ethinyl estradiol 0.12-0.015 MG/24HR vaginal ring Commonly known as:  NUVARING INSERT 1 RING VAGINALLY AS DIRECTED. REMOVE AFTER 3 WEEKS & WAIT 7 DAYS BEFORE INSERTING A NEW RING   ibuprofen 600 MG tablet Commonly known as:  ADVIL,MOTRIN Take 1 tablet (600 mg total) by mouth every 6 (six) hours as needed.   metroNIDAZOLE 500 MG tablet Commonly known as:  FLAGYL Take 1 tablet (500 mg total) by mouth 2 (two) times daily.         REVIEW OF SYSTEMS: A comprehensive ROS was conducted with the patient and is negative except as per HPI and below:  Review of Systems  Constitutional: Negative for  fever and weight loss.  HENT: Negative for congestion and sore throat.   Eyes: Positive for pain. Negative for blurred vision.  Respiratory: Positive for cough. Negative for sputum production.   Cardiovascular: Negative for chest pain and palpitations.  Gastrointestinal: Positive for diarrhea and nausea.  Genitourinary: Positive for frequency.  Skin: Negative.   Neurological: Positive for tingling and tremors.  Endo/Heme/Allergies: Negative for polydipsia.  Psychiatric/Behavioral: Positive for depression. The patient is nervous/anxious.        OBJECTIVE:  VS: BP 124/88 (BP Location: Left Arm, Patient Position: Sitting, Cuff Size:  Normal)   Pulse (!) 110   Resp 16   Ht 5\' 2"  (1.575 m)   Wt 166 lb (75.3 kg)   LMP 04/30/2018 Comment: nuvaring  SpO2 99%   BMI 30.36 kg/m    Wt Readings from Last 3 Encounters:  05/20/18 166 lb (75.3 kg)  05/06/18 166 lb (75.3 kg)  06/25/16 176 lb (79.8 kg)     EXAM: General: Pt appears well and is in NAD  Hydration: Well-hydrated with moist mucous membranes and good skin turgor  Eyes: External eye exam normal with stare, and lid lag, no exophthalmos.  EOM intact.   Ears, Nose, Throat: Hearing: Grossly intact bilaterally Dental: Good dentition  Throat: Clear without mass, erythema or exudate  Neck: General: Supple without adenopathy. Thyroid: Thyroid enlarged on the right side (? Nodule) . No thyroid bruit.  Lungs: Clear with good BS bilat with no rales, rhonchi, or wheezes  Heart: Auscultation: RRR.  Abdomen: Normoactive bowel sounds, soft, nontender, without masses or organomegaly palpable  Extremities:  BL LE: No pretibial edema normal ROM and strength.  Skin: Hair: Texture and amount normal with gender appropriate distribution Skin Inspection: No rashes. Skin Palpation: Skin temperature, texture, and thickness normal to palpation  Neuro: Cranial nerves: II - XII grossly intact  Motor: Normal strength throughout DTRs: 2+ and symmetric in UE without delay in relaxation phase  Mental Status: Judgment, insight: Intact Orientation: Oriented to time, place, and person Mood and affect: No depression, anxiety, or agitation     DATA REVIEWED:    Results for Barnetta ChapelHAVAS, Lyniah M (MRN [id number]) as of 05/21/2018 11:10  Ref. Range 05/20/2018 14:37  TSH Latest Ref Range: 0.35 - 4.50 uIU/mL <0.01 (L)  Triiodothyronine (T3) Latest Ref Range: 76 - 181 ng/dL 756430 (H)  E3,PIRJ(JOACZYT4,Free(Direct) Latest Ref Range: 0.60 - 1.60 ng/dL 6.062.58 (H)    ASSESSMENT/PLAN/RECOMMENDATIONS:   1. Hyperthyroidism secondary to Graves' disease:   -Patient with multiple symptoms that could be attributed to her  thyroid -She denies any local symptoms -She has been diagnosed with Graves' disease approximately 6 years ago and was lost to follow-up. -We have discussed the effects of hypothyroidism in osteoporosis, increasing risk of bone fractures, and increased risk of cardiac arrhythmias. - We discussed that Graves' disease is a result of an autoimmune condition involving the thyroid.  - We discussed with pt the benefits of methimazole in the Tx of hyperthyroidism, as well as the possible side effects/complications of anti-thyroid drug Tx (specifically detailing the rare, but serious side effect of agranulocytosis). She was informed of need for regular thyroid function monitoring while on methimazole to ensure appropriate dosage without over-treatment. As well, we discussed the possible side effects of methimazole including the chance of rash, the small chance of liver irritation/juandice and the <=1 in 300-400 chance of sudden onset agranulocytosis.  We discussed importance of going to ED promptly (and stopping methimazole) if shewere to develop significant  fever with severe sore throat of other evidence of acute infection.     - We extensively discussed the various treatment options for hyperthyroidism and Graves disease including ablation therapy with radioactive iodine versus antithyroid drug treatment versus surgical therapy.  We recommended to the patient that we felt, at this time, that I-131 ablation therapy would be most optimal.  We discussed the various possible benefits versus side effects of the various therapies.  -  I carefully explained to the patient that one of the consequences of I-131 ablation treatment would likely be permanent hypothyroidism which would require long-term replacement therapy with LT4.   Medications : Start atenolol 25 mg daily Methimazole 20  Mg daily STOP Kelp   2.  Graves' disease:  - No extrathyroidal manifestations of Graves' disease.  Addendum : Spoke to the  patient on 05/21/18 at 11 am about results .     Follow-up in 6 weeks Labs in 3 weeks   Signed electronically by: Lyndle Herrlich, MD  [facility] Endocr[facility]  [facility] [address] [place] [place], [place] [address] Phone: [phone] FAX: [phone]   CC: [address] [place] [address] Phone: [phone] Fax: [phone]   Return to Endocrinology clinic as below: Future Appointments  Date Time Provider Department Center  05/26/2018  9:00 AM Fontaine, Nadyne Coombes, MD [facility]  06/09/2018 10:30 AM [facility] LAB [facility] None  06/30/2018  1:40 PM Alexia Dinger, Konrad Dolores, MD [facility] None  05/11/2019 11:30 AM Harrington Challenger, NP [facility]

## 2018-05-21 ENCOUNTER — Telehealth: Payer: Self-pay | Admitting: Internal Medicine

## 2018-05-21 MED ORDER — METHIMAZOLE 10 MG PO TABS: 20.0000 mg | ORAL_TABLET | Freq: Every day | ORAL | 3 refills | Status: DC

## 2018-05-21 NOTE — Telephone Encounter (Signed)
Left a message for her to call me back to discuss results.     Abby Raelyn Mora, MD  [facility] Endocrinology  [facility] [address] [place], [place] [address] Phone: [phone] FAX: [phone]

## 2018-05-26 ENCOUNTER — Ambulatory Visit: Payer: BLUE CROSS/BLUE SHIELD | Admitting: Gynecology

## 2018-05-26 ENCOUNTER — Encounter: Payer: Self-pay | Admitting: Gynecology

## 2018-05-26 VITALS — BP 130/78

## 2018-05-26 DIAGNOSIS — N870 Mild cervical dysplasia: Secondary | ICD-10-CM | POA: Diagnosis not present

## 2018-05-26 DIAGNOSIS — R87610 Atypical squamous cells of undetermined significance on cytologic smear of cervix (ASC-US): Secondary | ICD-10-CM | POA: Diagnosis not present

## 2018-05-26 DIAGNOSIS — R87810 Cervical high risk human papillomavirus (HPV) DNA test positive: Secondary | ICD-10-CM | POA: Diagnosis not present

## 2018-05-26 NOTE — Progress Notes (Signed)
    Nina [name] [birth year] [id number]        23 y.o.  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  No other previously recorded Pap smears in her chart.  Does not verbally have a history of abnormal Pap smears.  Past medical history,surgical history, problem list, medications, allergies, family history and social history were all reviewed and documented in the EPIC chart.  Directed ROS with pertinent positives and negatives documented in the history of present illness/assessment and plan.  Exam: Nina [name] assistant Vitals:   05/26/18 0905  BP: 130/78   General appearance:  Normal Abdomen soft nontender without masses guarding rebound Pelvic external BUS vagina normal.  Cervix normal.  Uterus normal size midline mobile nontender.  Adnexa without masses or tenderness.  Colposcopy performed after acetic acid cleanse is adequate with acetowhite change noted at 12 and 6:00 transformation zone.  Representative biopsy taken at 12:00.  ECC performed.  Patient tolerated well. Physical Exam  Genitourinary:      Assessment/Plan:  23 y.o. 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  Colposcopy shows acetowhite change at 12 and 6:00 transformation zones.  No punctations, mosaicism, or atypical vessels.  Representative biopsy taken at 12:00.  ECC performed.  The patient, mother and I discussed dysplasia, high-grade/low-grade, progression/regression and the HPV association.  Patient will follow-up for results.  Assuming low-grade then recommend follow-up Pap smear/HPV 1 year.  If otherwise then will triage based upon results.    Nina Lords MD, 10:03 AM 05/26/2018

## 2018-05-26 NOTE — Patient Instructions (Signed)
Office will call you with biopsy results 

## 2018-05-27 LAB — T3: T3, Total: 430 ng/dL — ABNORMAL HIGH (ref 76–181)

## 2018-05-27 LAB — TRAB (TSH RECEPTOR BINDING ANTIBODY): TRAB: 13.57 IU/L — AB (ref <=2.00)

## 2018-05-28 ENCOUNTER — Encounter: Payer: Self-pay | Admitting: Gynecology

## 2018-05-28 LAB — TISSUE PATH REPORT

## 2018-05-28 LAB — PATHOLOGY

## 2018-05-29 ENCOUNTER — Encounter: Payer: Self-pay | Admitting: Gynecology

## 2018-06-05 ENCOUNTER — Other Ambulatory Visit: Payer: Self-pay | Admitting: Internal Medicine

## 2018-06-05 DIAGNOSIS — E0590 Thyrotoxicosis, unspecified without thyrotoxic crisis or storm: Secondary | ICD-10-CM

## 2018-06-09 ENCOUNTER — Other Ambulatory Visit: Payer: BLUE CROSS/BLUE SHIELD

## 2018-06-10 DIAGNOSIS — F332 Major depressive disorder, recurrent severe without psychotic features: Secondary | ICD-10-CM | POA: Diagnosis not present

## 2018-06-17 DIAGNOSIS — F332 Major depressive disorder, recurrent severe without psychotic features: Secondary | ICD-10-CM | POA: Diagnosis not present

## 2018-06-24 DIAGNOSIS — F332 Major depressive disorder, recurrent severe without psychotic features: Secondary | ICD-10-CM | POA: Diagnosis not present

## 2018-06-30 ENCOUNTER — Ambulatory Visit: Payer: BLUE CROSS/BLUE SHIELD | Admitting: Internal Medicine

## 2018-07-01 DIAGNOSIS — F332 Major depressive disorder, recurrent severe without psychotic features: Secondary | ICD-10-CM | POA: Diagnosis not present

## 2018-07-06 DIAGNOSIS — F332 Major depressive disorder, recurrent severe without psychotic features: Secondary | ICD-10-CM | POA: Diagnosis not present

## 2018-08-10 ENCOUNTER — Other Ambulatory Visit: Payer: Self-pay | Admitting: Internal Medicine

## 2018-08-11 DIAGNOSIS — F332 Major depressive disorder, recurrent severe without psychotic features: Secondary | ICD-10-CM | POA: Diagnosis not present

## 2018-08-12 ENCOUNTER — Ambulatory Visit (INDEPENDENT_AMBULATORY_CARE_PROVIDER_SITE_OTHER): Payer: BLUE CROSS/BLUE SHIELD | Admitting: Internal Medicine

## 2018-08-12 ENCOUNTER — Other Ambulatory Visit: Payer: Self-pay

## 2018-08-12 ENCOUNTER — Encounter: Payer: Self-pay | Admitting: Internal Medicine

## 2018-08-12 DIAGNOSIS — E0500 Thyrotoxicosis with diffuse goiter without thyrotoxic crisis or storm: Secondary | ICD-10-CM | POA: Diagnosis not present

## 2018-08-12 DIAGNOSIS — E0590 Thyrotoxicosis, unspecified without thyrotoxic crisis or storm: Secondary | ICD-10-CM | POA: Diagnosis not present

## 2018-08-12 DIAGNOSIS — F332 Major depressive disorder, recurrent severe without psychotic features: Secondary | ICD-10-CM | POA: Diagnosis not present

## 2018-08-12 NOTE — Progress Notes (Signed)
Virtual Visit via Video Note  I connected with Nina [name] on 08/12/18 at  1:40 PM EDT by a video enabled telemedicine application and verified that I am speaking with the correct person using two identifiers.   I discussed the limitations of evaluation and management by telemedicine and the availability of in person appointments. The patient expressed understanding and agreed to proceed.  -Location of the patient : Home -Location of the provider : Office -The names of all persons participating in the telemedicine service : Pt and myself    Name: Nina [name]  MRN/ DOB: [id number], 11-May-1995    Age/ Sex: 23 y.o., female     PCP: Danie BinderNelson, Kristen M, PA-C   Reason for Endocrinology Evaluation: Hyperthyroidism     Initial Endocrinology Clinic Visit: 05/20/2018    PATIENT IDENTIFIER: Nina [name] is a 23 y.o., female with a past medical history of Graves' Disease, depression , borderline personality disorder, history of excessive alcohol intake and anxiety . She has followed with [facility] since 05/20/2018 for consultative assistance with management of her hyperthyroidism.   HISTORICAL SUMMARY: The patient was first diagnosed with Graves' Disease at the age of 23 , she was put on a B-blocker and something else that she took for 6 months and was lost to follow up until she went to have a routine check up during her Gyn visit in February, 2020 and was found to have a TSH of 0.01 uIU/mL   Methimazole was started in 05/2018  SUBJECTIVE:   During last visit (05/20/2018): She was started on Methimazole 20 mg and atenolol 25 mg daily   Today (08/12/2018):  Nina [name] is here for a virtual 2 month follow up on hyperthyroidism. Since her last visit, she has been furloughed from her work, she continues to have health insurance though her family but was unable to afford the co-pay for her meds, so she has not taken methimazole for weeks. She took it for one month #60 after I  saw her last . She does not she has taken it long enough to notice a difference in her symptoms except improvement in tremors.  Since being off she has noted worsening tremors, she also has worsening anxiety but has not taken anxiety meds as well.  She denies palpitations but has diarrhea. No weight loss.  Denies local neck symptoms.   Pt with blurry vision and allergy symptoms but no pain, irritation or double vision.    ROS:  As per HPI.   HISTORY:  Past Medical History:  Past Medical History:  Diagnosis Date  . Borderline personality disorder (HCC)   . LGSIL of cervix of undetermined significance 05/2018   On colposcopic biopsy  . Thyroid disease   . Vertigo      Past Surgical History: No past surgical history on file.  Social History:  reports that she has been smoking. She has never used smokeless tobacco. She reports current alcohol use. She reports current drug use. Drug: Marijuana. Family History:  Family History  Problem Relation Age of Onset  . Hypertension Paternal Grandmother   . Thyroid disease Paternal Grandmother   . Hypertension Paternal Grandfather   . Thyroid disease Maternal Grandmother       HOME MEDICATIONS: Allergies as of 08/12/2018      Reactions   Azithromycin Rash      Medication List       Accurate as of Aug 12, 2018 10:53 AM. If you have any  questions, ask your nurse or doctor.        amphetamine-dextroamphetamine 20 MG tablet Commonly known as:  ADDERALL Take 20 mg by mouth 2 (two) times daily.   atenolol 25 MG tablet Commonly known as:  TENORMIN Take 1 tablet (25 mg total) by mouth daily.   clonazePAM 0.5 MG tablet Commonly known as:  KLONOPIN 0.5 mg.   divalproex 250 MG DR tablet Commonly known as:  DEPAKOTE Take 250 mg by mouth 2 (two) times daily.   escitalopram 20 MG tablet Commonly known as:  LEXAPRO Take 20 mg by mouth 2 (two) times daily.   etonogestrel-ethinyl estradiol 0.12-0.015 MG/24HR vaginal ring  Commonly known as:  NuvaRing INSERT 1 RING VAGINALLY AS DIRECTED. REMOVE AFTER 3 WEEKS & WAIT 7 DAYS BEFORE INSERTING A NEW RING   ibuprofen 600 MG tablet Commonly known as:  ADVIL Take 1 tablet (600 mg total) by mouth every 6 (six) hours as needed.   methimazole 10 MG tablet Commonly known as:  TAPAZOLE Take 2 tablets (20 mg total) by mouth daily.   metroNIDAZOLE 500 MG tablet Commonly known as:  FLAGYL Take 1 tablet (500 mg total) by mouth 2 (two) times daily.          DATA REVIEWED: Results for Nina [name] (MRN [id number]) as of 08/12/2018 13:18  Ref. Range 05/20/2018 14:37  TSH Latest Ref Range: 0.35 - 4.50 uIU/mL <0.01 (L)  Triiodothyronine (T3) Latest Ref Range: 76 - 181 ng/dL 809 (H)  X8,PJAS(NKNLZJ) Latest Ref Range: 0.60 - 1.60 ng/dL 6.73 (H)    Results for Nina [name] (MRN [id number]) as of 08/12/2018 13:18  Ref. Range 05/20/2018 14:37  TRAB Latest Ref Range: <=2.00 IU/L 13.57 (H)    ASSESSMENT / PLAN / RECOMMENDATIONS:   1. Hyperthyroidism Secondary to Graves' Disease:   - She continues to be clinically hyperthyroid, she has not taken methimazole in a few weeks, but will pick it up today.  - Discussed the risk of cardiac arrhythmia and its consequences of uncontrolled hyperthyroidism.  - Discussed the importance of compliance with methimazole to facilitate remission.  - Denies local neck symptoms     Medications  Methimazole 20 mg daily  Atenolol 25 mg daily    2. Graves' Disease:   - No extrathyroidal manifestations of graves' disease   I discussed the assessment and treatment plan with the patient. The patient was provided an opportunity to ask questions and all were answered. The patient agreed with the plan and demonstrated an understanding of the instructions.   The patient was advised to call back or seek an in-person evaluation if the symptoms worsen or if the condition fails to improve as anticipated.  Labs in 4 weeks, pt will call to  schedule a 2 month office visit follow up   Signed electronically by: Lyndle Herrlich, MD  [facility] Endocrinology  [facility] [address] [place], [place] [address] Phone: [phone] FAX: [phone]   CC: Rosalva [address] [place] [address] Phone: [phone] Fax: [phone]   Return to Endocrinology clinic as below: Future Appointments  Date Time Provider Department Center  08/12/2018  1:40 PM Lior Cartelli, Konrad Dolores, MD [facility] None  05/11/2019 11:30 AM Harrington Challenger, NP [facility]

## 2018-08-27 DIAGNOSIS — F332 Major depressive disorder, recurrent severe without psychotic features: Secondary | ICD-10-CM | POA: Diagnosis not present

## 2018-10-12 DIAGNOSIS — F332 Major depressive disorder, recurrent severe without psychotic features: Secondary | ICD-10-CM | POA: Diagnosis not present

## 2018-10-13 DIAGNOSIS — F332 Major depressive disorder, recurrent severe without psychotic features: Secondary | ICD-10-CM | POA: Diagnosis not present

## 2018-11-07 ENCOUNTER — Other Ambulatory Visit: Payer: Self-pay | Admitting: Internal Medicine

## 2018-12-08 DIAGNOSIS — F332 Major depressive disorder, recurrent severe without psychotic features: Secondary | ICD-10-CM | POA: Diagnosis not present

## 2018-12-16 DIAGNOSIS — F332 Major depressive disorder, recurrent severe without psychotic features: Secondary | ICD-10-CM | POA: Diagnosis not present

## 2018-12-21 ENCOUNTER — Encounter: Payer: Self-pay | Admitting: Gynecology

## 2018-12-28 DIAGNOSIS — M79675 Pain in left toe(s): Secondary | ICD-10-CM | POA: Diagnosis not present

## 2019-01-26 DIAGNOSIS — Z20828 Contact with and (suspected) exposure to other viral communicable diseases: Secondary | ICD-10-CM | POA: Diagnosis not present

## 2019-02-07 ENCOUNTER — Other Ambulatory Visit: Payer: Self-pay | Admitting: Internal Medicine

## 2019-02-22 ENCOUNTER — Other Ambulatory Visit: Payer: Self-pay | Admitting: Internal Medicine

## 2019-03-08 DIAGNOSIS — F332 Major depressive disorder, recurrent severe without psychotic features: Secondary | ICD-10-CM | POA: Diagnosis not present

## 2019-04-23 DIAGNOSIS — F332 Major depressive disorder, recurrent severe without psychotic features: Secondary | ICD-10-CM | POA: Diagnosis not present

## 2019-04-26 ENCOUNTER — Other Ambulatory Visit: Payer: Self-pay

## 2019-04-28 ENCOUNTER — Other Ambulatory Visit: Payer: Self-pay

## 2019-04-28 ENCOUNTER — Encounter: Payer: Self-pay | Admitting: Women's Health

## 2019-04-28 ENCOUNTER — Ambulatory Visit (INDEPENDENT_AMBULATORY_CARE_PROVIDER_SITE_OTHER): Payer: BC Managed Care – PPO | Admitting: Women's Health

## 2019-04-28 VITALS — BP 120/78

## 2019-04-28 DIAGNOSIS — R222 Localized swelling, mass and lump, trunk: Secondary | ICD-10-CM

## 2019-04-28 NOTE — Progress Notes (Signed)
24 year old S WF G0 presents with complaint of questionable bulge in suprapubic area that she noticed after shaving which scared her.  Nontender, no change in bowel elimination.  Denies heavy lifting or change in exercise.  Denies vaginal discharge, urinary symptoms, abdominal pain, nausea, fever.  Monthly cycle on NuvaRing, same partner.  Hypothyroid on Tapazole per endocrinologist.  Exam: Appears well.  No apparent bulge in the suprapubic area in the groin with standing, sitting or bearing down.  Area of concern left mons pubis which has some fatty tissue, no erythema, nontender with palpation and no palpable mass, hernia.  Normal exam  Plan: Reviewed normality of exam, will watch at this time.  Reassurance given.

## 2019-05-10 ENCOUNTER — Other Ambulatory Visit: Payer: Self-pay

## 2019-05-11 ENCOUNTER — Encounter: Payer: BC Managed Care – PPO | Admitting: Women's Health

## 2019-05-13 DIAGNOSIS — R197 Diarrhea, unspecified: Secondary | ICD-10-CM | POA: Diagnosis not present

## 2019-05-13 DIAGNOSIS — R1110 Vomiting, unspecified: Secondary | ICD-10-CM | POA: Diagnosis not present

## 2019-05-17 DIAGNOSIS — Z0000 Encounter for general adult medical examination without abnormal findings: Secondary | ICD-10-CM | POA: Diagnosis not present

## 2019-05-17 DIAGNOSIS — E0500 Thyrotoxicosis with diffuse goiter without thyrotoxic crisis or storm: Secondary | ICD-10-CM | POA: Diagnosis not present

## 2019-05-17 DIAGNOSIS — R591 Generalized enlarged lymph nodes: Secondary | ICD-10-CM | POA: Diagnosis not present

## 2019-05-17 DIAGNOSIS — Z136 Encounter for screening for cardiovascular disorders: Secondary | ICD-10-CM | POA: Diagnosis not present

## 2019-05-21 DIAGNOSIS — F332 Major depressive disorder, recurrent severe without psychotic features: Secondary | ICD-10-CM | POA: Diagnosis not present

## 2019-05-24 DIAGNOSIS — R591 Generalized enlarged lymph nodes: Secondary | ICD-10-CM | POA: Diagnosis not present

## 2019-05-24 DIAGNOSIS — E0500 Thyrotoxicosis with diffuse goiter without thyrotoxic crisis or storm: Secondary | ICD-10-CM | POA: Diagnosis not present

## 2019-05-24 DIAGNOSIS — Z0000 Encounter for general adult medical examination without abnormal findings: Secondary | ICD-10-CM | POA: Diagnosis not present

## 2019-05-27 DIAGNOSIS — Z23 Encounter for immunization: Secondary | ICD-10-CM | POA: Diagnosis not present

## 2019-06-23 ENCOUNTER — Ambulatory Visit: Payer: BC Managed Care – PPO | Admitting: Women's Health

## 2019-06-23 ENCOUNTER — Other Ambulatory Visit: Payer: Self-pay

## 2019-06-23 ENCOUNTER — Encounter: Payer: Self-pay | Admitting: Women's Health

## 2019-06-23 VITALS — BP 118/80 | Ht 61.0 in | Wt 165.0 lb

## 2019-06-23 DIAGNOSIS — Z3044 Encounter for surveillance of vaginal ring hormonal contraceptive device: Secondary | ICD-10-CM | POA: Diagnosis not present

## 2019-06-23 DIAGNOSIS — R87610 Atypical squamous cells of undetermined significance on cytologic smear of cervix (ASC-US): Secondary | ICD-10-CM | POA: Diagnosis not present

## 2019-06-23 DIAGNOSIS — Z01419 Encounter for gynecological examination (general) (routine) without abnormal findings: Secondary | ICD-10-CM

## 2019-06-23 MED ORDER — ETONOGESTREL-ETHINYL ESTRADIOL 0.12-0.015 MG/24HR VA RING: VAGINAL_RING | VAGINAL | 4 refills | Status: AC

## 2019-06-23 NOTE — Patient Instructions (Signed)
MVI daily Health Maintenance, Female Adopting a healthy lifestyle and getting preventive care are important in promoting health and wellness. Ask your health care provider about:  The right schedule for you to have regular tests and exams.  Things you can do on your own to prevent diseases and keep yourself healthy. What should I know about diet, weight, and exercise? Eat a healthy diet   Eat a diet that includes plenty of vegetables, fruits, low-fat dairy products, and lean protein.  Do not eat a lot of foods that are high in solid fats, added sugars, or sodium. Maintain a healthy weight Body mass index (BMI) is used to identify weight problems. It estimates body fat based on height and weight. Your health care provider can help determine your BMI and help you achieve or maintain a healthy weight. Get regular exercise Get regular exercise. This is one of the most important things you can do for your health. Most adults should:  Exercise for at least 150 minutes each week. The exercise should increase your heart rate and make you sweat (moderate-intensity exercise).  Do strengthening exercises at least twice a week. This is in addition to the moderate-intensity exercise.  Spend less time sitting. Even light physical activity can be beneficial. Watch cholesterol and blood lipids Have your blood tested for lipids and cholesterol at 25 years of age, then have this test every 5 years. Have your cholesterol levels checked more often if:  Your lipid or cholesterol levels are high.  You are older than 25 years of age.  You are at high risk for heart disease. What should I know about cancer screening? Depending on your health history and family history, you may need to have cancer screening at various ages. This may include screening for:  Breast cancer.  Cervical cancer.  Colorectal cancer.  Skin cancer.  Lung cancer. What should I know about heart disease, diabetes, and high  blood pressure? Blood pressure and heart disease  High blood pressure causes heart disease and increases the risk of stroke. This is more likely to develop in people who have high blood pressure readings, are of African descent, or are overweight.  Have your blood pressure checked: ? Every 3-5 years if you are 18-39 years of age. ? Every year if you are 40 years old or older. Diabetes Have regular diabetes screenings. This checks your fasting blood sugar level. Have the screening done:  Once every three years after age 40 if you are at a normal weight and have a low risk for diabetes.  More often and at a younger age if you are overweight or have a high risk for diabetes. What should I know about preventing infection? Hepatitis B If you have a higher risk for hepatitis B, you should be screened for this virus. Talk with your health care provider to find out if you are at risk for hepatitis B infection. Hepatitis C Testing is recommended for:  Everyone born from 1945 through 1965.  Anyone with known risk factors for hepatitis C. Sexually transmitted infections (STIs)  Get screened for STIs, including gonorrhea and chlamydia, if: ? You are sexually active and are younger than 24 years of age. ? You are older than 24 years of age and your health care provider tells you that you are at risk for this type of infection. ? Your sexual activity has changed since you were last screened, and you are at increased risk for chlamydia or gonorrhea. Ask your health care   provider if you are at risk.  Ask your health care provider about whether you are at high risk for HIV. Your health care provider may recommend a prescription medicine to help prevent HIV infection. If you choose to take medicine to prevent HIV, you should first get tested for HIV. You should then be tested every 3 months for as long as you are taking the medicine. Pregnancy  If you are about to stop having your period  (premenopausal) and you may become pregnant, seek counseling before you get pregnant.  Take 400 to 800 micrograms (mcg) of folic acid every day if you become pregnant.  Ask for birth control (contraception) if you want to prevent pregnancy. Osteoporosis and menopause Osteoporosis is a disease in which the bones lose minerals and strength with aging. This can result in bone fractures. If you are 49 years old or older, or if you are at risk for osteoporosis and fractures, ask your health care provider if you should:  Be screened for bone loss.  Take a calcium or vitamin D supplement to lower your risk of fractures.  Be given hormone replacement therapy (HRT) to treat symptoms of menopause. Follow these instructions at home: Lifestyle  Do not use any products that contain nicotine or tobacco, such as cigarettes, e-cigarettes, and chewing tobacco. If you need help quitting, ask your health care provider.  Do not use street drugs.  Do not share needles.  Ask your health care provider for help if you need support or information about quitting drugs. Alcohol use  Do not drink alcohol if: ? Your health care provider tells you not to drink. ? You are pregnant, may be pregnant, or are planning to become pregnant.  If you drink alcohol: ? Limit how much you use to 0-1 drink a day. ? Limit intake if you are breastfeeding.  Be aware of how much alcohol is in your drink. In the [place] one drink equals one 12 oz bottle of beer (355 mL), one 5 oz glass of wine (148 mL), or one 1 oz glass of hard liquor (44 mL). General instructions  Schedule regular health, dental, and eye exams.  Stay current with your vaccines.  Tell your health care provider if: ? You often feel depressed. ? You have ever been abused or do not feel safe at home. Summary  Adopting a healthy lifestyle and getting preventive care are important in promoting health and wellness.  Follow your health care provider's  instructions about healthy diet, exercising, and getting tested or screened for diseases.  Follow your health care provider's instructions on monitoring your cholesterol and blood pressure. This information is not intended to replace advice given to you by your health care provider. Make sure you discuss any questions you have with your health care provider. Document Revised: 03/11/2018 Document Reviewed: 03/11/2018 Elsevier Patient Education  2020 [employer].

## 2019-06-23 NOTE — Addendum Note (Signed)
Addended by: Tito Dine on: 06/23/2019 11:15 AM   Modules accepted: Orders
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
## 2019-06-30 LAB — PAP IG W/ RFLX HPV ASCU

## 2019-06-30 LAB — HUMAN PAPILLOMAVIRUS, HIGH RISK: HPV DNA High Risk: NOT DETECTED

## 2019-08-10 DIAGNOSIS — F332 Major depressive disorder, recurrent severe without psychotic features: Secondary | ICD-10-CM | POA: Diagnosis not present

## 2019-08-11 DIAGNOSIS — F332 Major depressive disorder, recurrent severe without psychotic features: Secondary | ICD-10-CM | POA: Diagnosis not present

## 2019-08-16 ENCOUNTER — Other Ambulatory Visit: Payer: Self-pay

## 2019-08-17 NOTE — Progress Notes (Deleted)
Name: Nina [name]  MRN/ DOB: [id number], Jan 08, 1996    Age/ Sex: 24 y.o., female     PCP: Patient, No Pcp Per   Reason for Endocrinology Evaluation: Hyperthyroidism     Initial Endocrinology Clinic Visit: 05/20/2018    PATIENT IDENTIFIER: Nina [name] is a 24 y.o., female with a past medical history of Graves' disease. She has followed with [facility] since 05/21/2019 for consultative assistance with management of her hyperthyroidism.   HISTORICAL SUMMARY: The patient was first diagnosed with Graves' Disease at the age of 51 , she was put on a B-blocker and something else that she took for 6 months and was lost to follow up until she went to have a routine check up during her Gyn visit in February, 2020 and was found to have a TSH of 0.01 uIU/mL    Methimazole started 05/2018  Maternal and paternal grandmothers with thyroid disease   SUBJECTIVE:   During last visit (08/12/2018): This was a virtual visit, she was maintained on methimazole   Today (08/18/2019):  Ms. [name] is here for a follow up on hyperthyroidism, she has not been to our office in a year.    ROS: As per HPI.   HISTORY:  Past Medical History:  Past Medical History:  Diagnosis Date  . Borderline personality disorder (HCC)   . LGSIL of cervix of undetermined significance 05/2018   On colposcopic biopsy  . Thyroid disease   . Vertigo     Past Surgical History: No past surgical history on file.  Social History:  reports that she has quit smoking. Her smoking use included cigarettes and e-cigarettes. She has never used smokeless tobacco. She reports current alcohol use. She reports current drug use. Drug: Marijuana. Family History:  Family History  Problem Relation Age of Onset  . Hypertension Paternal Grandmother   . Thyroid disease Paternal Grandmother   . Hypertension Paternal Grandfather   . Thyroid disease Maternal Grandmother      HOME MEDICATIONS: Allergies as of  08/18/2019      Reactions   Azithromycin Rash      Medication List       Accurate as of Aug 18, 2019  7:27 AM. If you have any questions, ask your nurse or doctor.        amphetamine-dextroamphetamine 20 MG tablet Commonly known as: ADDERALL Take 20 mg by mouth 2 (two) times daily.   atenolol 25 MG tablet Commonly known as: TENORMIN Take 1 tablet (25 mg total) by mouth daily. WILL PROVIDE 30 DAY SUPPLY. MUST CALL OFFICE TO SCHEDULE APPT   clonazePAM 0.5 MG tablet Commonly known as: KLONOPIN 0.5 mg.   divalproex 250 MG DR tablet Commonly known as: DEPAKOTE Take 250 mg by mouth 2 (two) times daily.   etonogestrel-ethinyl estradiol 0.12-0.015 MG/24HR vaginal ring Commonly known as: NuvaRing INSERT 1 RING VAGINALLY AS DIRECTED. REMOVE AFTER 3 WEEKS & WAIT 7 DAYS BEFORE INSERTING A NEW RING   ibuprofen 600 MG tablet Commonly known as: ADVIL Take 1 tablet (600 mg total) by mouth every 6 (six) hours as needed.   methimazole 10 MG tablet Commonly known as: TAPAZOLE Take 2 tablets (20 mg total) by mouth daily. WILL PROVIDE 30 DAY SUPPLY. MUST CALL OFFICE TO SCHEDULE APPT         OBJECTIVE:   PHYSICAL EXAM: VS: There were no vitals taken for this visit.   EXAM: General: Pt appears well and is in NAD  Hydration: Well-hydrated  with moist mucous membranes and good skin turgor  Eyes: External eye exam normal without stare, lid lag or exophthalmos.  EOM intact.  PERRL.  Ears, Nose, Throat: Hearing: Grossly intact bilaterally Dental: Good dentition  Throat: Clear without mass, erythema or exudate  Neck: General: Supple without adenopathy. Thyroid: Thyroid size normal.  No goiter or nodules appreciated. No thyroid bruit.  Lungs: Clear with good BS bilat with no rales, rhonchi, or wheezes  Heart: Auscultation: RRR.  Abdomen: Normoactive bowel sounds, soft, nontender, without masses or organomegaly palpable  Extremities: Gait and station: Normal gait  Digits and nails: No  clubbing, cyanosis, petechiae, or nodes Head and neck: Normal alignment and mobility BL UE: Normal ROM and strength. BL LE: No pretibial edema normal ROM and strength.  Skin: Hair: Texture and amount normal with gender appropriate distribution Skin Inspection: No rashes, acanthosis nigricans/skin tags. No lipohypertrophy Skin Palpation: Skin temperature, texture, and thickness normal to palpation  Neuro: Cranial nerves: II - XII grossly intact  Cerebellar: Normal coordination and movement; no tremor Motor: Normal strength throughout DTRs: 2+ and symmetric in UE without delay in relaxation phase  Mental Status: Judgment, insight: Intact Orientation: Oriented to time, place, and person Memory: Intact for recent and remote events Mood and affect: No depression, anxiety, or agitation     DATA REVIEWED: ***    ASSESSMENT / PLAN / RECOMMENDATIONS:   1. ***  Plan:  ***    Medications   ***   Signed electronically by: Mack Guise, MD  [facility] Endocrinology  [address] [place], [place] [address] Phone: [phone] FAX: [phone]      CC: Patient, No Pcp Per No address on file Phone: None  Fax: None   Return to Endocrinology clinic as below: Future Appointments  Date Time Provider [place]  08/18/2019  8:30 AM Jessika Rothery, Melanie Crazier, MD [facility] None

## 2019-08-18 ENCOUNTER — Telehealth: Payer: Self-pay | Admitting: Internal Medicine

## 2019-08-18 ENCOUNTER — Ambulatory Visit: Payer: BLUE CROSS/BLUE SHIELD | Admitting: Internal Medicine

## 2019-08-18 NOTE — Telephone Encounter (Signed)
Pt rescheduled appt she had today to 08/25/19

## 2019-08-18 NOTE — Telephone Encounter (Signed)
Patient called re: Patient was in [place] and went to Urgent Care. Her blood pressure was 162/90. Once patient returned to [facility] she had bp checked again and it was 133/88. Patient was told by the Nurse to notify Dr. Lonzo Cloud and her PCP about her high blood pressure.

## 2019-08-23 ENCOUNTER — Other Ambulatory Visit: Payer: Self-pay

## 2019-08-25 ENCOUNTER — Encounter: Payer: Self-pay | Admitting: Internal Medicine

## 2019-08-25 ENCOUNTER — Ambulatory Visit: Payer: BC Managed Care – PPO | Admitting: Internal Medicine

## 2019-08-25 ENCOUNTER — Other Ambulatory Visit: Payer: Self-pay

## 2019-08-25 VITALS — BP 120/80 | HR 78 | Temp 98.1°F | Ht 61.0 in | Wt 158.6 lb

## 2019-08-25 DIAGNOSIS — E0500 Thyrotoxicosis with diffuse goiter without thyrotoxic crisis or storm: Secondary | ICD-10-CM | POA: Diagnosis not present

## 2019-08-25 NOTE — Progress Notes (Signed)
Name: Nina [name]  MRN/ DOB: [id number], Aug 16, 1995    Age/ Sex: 24 y.o., female     PCP: Patient, No Pcp Per   Reason for Endocrinology Evaluation: Hyperthyroidism     Initial Endocrinology Clinic Visit: 05/20/2018    PATIENT IDENTIFIER: Nina [name] is a 24 y.o., female with a past medical history of Graves' disease. She has followed with [facility] since 05/21/2019 for consultative assistance with management of her hyperthyroidism.   HISTORICAL SUMMARY: The patient was first diagnosed with Graves' Disease at the age of 79 , she was put on a B-blocker and something else that she took for 6 months and was lost to follow up until she went to have a routine check up during her Gyn visit in February, 2020 and was found to have a TSH of 0.01 uIU/mL    Methimazole started 05/2018  Maternal and paternal grandmothers with thyroid disease   SUBJECTIVE:   During last visit (08/12/2018): This was a virtual visit, she was maintained on methimazole   Today (08/27/2019):  Nina [name] is here for a follow up on hyperthyroidism, she has not been to our office in a year.  She has been off methimazole for months.    For the past 5 months has had intentional weight loss  Has occasional diarrhea, heat intolerance has improved Has also noted improved tremors and anxiety     ROS: As per HPI.   HISTORY:  Past Medical History:  Past Medical History:  Diagnosis Date  . Borderline personality disorder ([place])   . LGSIL of cervix of undetermined significance 05/2018   On colposcopic biopsy  . Thyroid disease   . Vertigo     Past Surgical History: No past surgical history on file.  Social History:  reports that she has quit smoking. Her smoking use included cigarettes and e-cigarettes. She has never used smokeless tobacco. She reports current alcohol use. She reports current drug use. Drug: Marijuana. Family History:  Family History  Problem Relation Age of Onset  .  Hypertension Paternal Grandmother   . Thyroid disease Paternal Grandmother   . Hypertension Paternal Grandfather   . Thyroid disease Maternal Grandmother      HOME MEDICATIONS: Allergies as of 08/25/2019      Reactions   Azithromycin Rash      Medication List       Accurate as of Aug 25, 2019 11:59 PM. If you have any questions, ask your nurse or doctor.        amphetamine-dextroamphetamine 20 MG tablet Commonly known as: ADDERALL Take 20 mg by mouth 2 (two) times daily.   atenolol 25 MG tablet Commonly known as: TENORMIN Take 1 tablet (25 mg total) by mouth daily. WILL PROVIDE 30 DAY SUPPLY. MUST CALL OFFICE TO SCHEDULE APPT   clonazePAM 0.5 MG tablet Commonly known as: KLONOPIN 0.5 mg.   divalproex 250 MG DR tablet Commonly known as: DEPAKOTE Take 250 mg by mouth 2 (two) times daily.   etonogestrel-ethinyl estradiol 0.12-0.015 MG/24HR vaginal ring Commonly known as: NuvaRing INSERT 1 RING VAGINALLY AS DIRECTED. REMOVE AFTER 3 WEEKS & WAIT 7 DAYS BEFORE INSERTING A NEW RING   ibuprofen 600 MG tablet Commonly known as: ADVIL Take 1 tablet (600 mg total) by mouth every 6 (six) hours as needed.   methimazole 10 MG tablet Commonly known as: TAPAZOLE Take 2 tablets (20 mg total) by mouth daily. WILL PROVIDE 30 DAY SUPPLY. MUST CALL OFFICE TO SCHEDULE APPT  OBJECTIVE:   PHYSICAL EXAM: VS: BP 120/80 (BP Location: Left Arm, Patient Position: Sitting, Cuff Size: Large)   Pulse 78   Temp 98.1 F (36.7 C)   Ht 5\' 1"  (1.549 m)   Wt 158 lb 9.6 oz (71.9 kg)   LMP 08/17/2019 (Exact Date)   SpO2 99%   BMI 29.97 kg/m    EXAM: General: Pt appears well and is in NAD  Neck: General: Supple without adenopathy. Thyroid: Thyroid size normal.  No goiter or nodules appreciated. No thyroid bruit.  Lungs: Clear with good BS bilat with no rales, rhonchi, or wheezes  Heart: Auscultation: RRR.  Abdomen: Normoactive bowel sounds, soft, nontender, without masses or  organomegaly palpable  Extremities:  BL LE: No pretibial edema normal ROM and strength.  Skin: Hair: Texture and amount normal with gender appropriate distribution Skin Inspection: No rashes Skin Palpation: Skin temperature, texture, and thickness normal to palpation  Mental Status: Judgment, insight: Intact Orientation: Oriented to time, place, and person Memory: Intact for recent and remote events Mood and affect: No depression, anxiety, or agitation     DATA REVIEWED:  Results for Nina [name], Nina [name] (MRN Barnetta Chapel) as of 08/27/2019 13:50  Ref. Range 08/27/2019 08:52  TSH Latest Ref Range: 0.35 - 4.50 uIU/mL <0.01 (L)  T4,Free(Direct) Latest Ref Range: 0.60 - 1.60 ng/dL 08/29/2019     ASSESSMENT / PLAN / RECOMMENDATIONS:   1. Hyperthyroidism Secondary to Graves' Disease:   - She is clinically euthyroid - No local neck symptoms - She has not been on methimazole in months, given suppressed TSH on today's labs will restart methimazole as below   Medications  Methimazole 5 mg daily     2. Graves' Disease:   - No extrathyroidal manifestations of graves' disease    F/u in 3 months  Signed electronically by: 1.88, MD  [facility]  [facility] [address] [place] [place], [place] Phone: [phone] FAX: [phone]      CC: Patient, No Pcp Per No address on file Phone: None  Fax: None   Return to Endocrinology clinic as below: Future Appointments  Date Time Provider Department Center  11/29/2019  9:10 AM Ahjanae Cassel, 12/01/2019, MD [facility] None

## 2019-08-25 NOTE — Patient Instructions (Signed)
-   Please stop by the lab today  

## 2019-08-26 ENCOUNTER — Other Ambulatory Visit: Payer: BC Managed Care – PPO

## 2019-08-27 ENCOUNTER — Other Ambulatory Visit: Payer: Self-pay

## 2019-08-27 ENCOUNTER — Other Ambulatory Visit (INDEPENDENT_AMBULATORY_CARE_PROVIDER_SITE_OTHER): Payer: BC Managed Care – PPO

## 2019-08-27 DIAGNOSIS — E0500 Thyrotoxicosis with diffuse goiter without thyrotoxic crisis or storm: Secondary | ICD-10-CM

## 2019-08-27 LAB — TSH: TSH: <0.01 u[IU]/mL — ABNORMAL LOW (ref 0.35–4.50)

## 2019-08-27 LAB — T4, FREE: Free T4: 1.2 ng/dL (ref 0.60–1.60)

## 2019-08-27 MED ORDER — METHIMAZOLE 5 MG PO TABS: 5.0000 mg | ORAL_TABLET | Freq: Every day | ORAL | 1 refills | Status: DC

## 2019-08-28 LAB — T3: T3, Total: 202 ng/dL — ABNORMAL HIGH (ref 76–181)

## 2019-09-23 ENCOUNTER — Telehealth: Payer: Self-pay | Admitting: Internal Medicine

## 2019-09-23 DIAGNOSIS — R350 Frequency of micturition: Secondary | ICD-10-CM | POA: Diagnosis not present

## 2019-09-23 DIAGNOSIS — J029 Acute pharyngitis, unspecified: Secondary | ICD-10-CM | POA: Diagnosis not present

## 2019-09-23 NOTE — Telephone Encounter (Signed)
Medication Refill Request  Did you call your pharmacy and request this refill first? Yes  . If patient has not contacted pharmacy first, instruct them to do so for future refills.  . Remind them that contacting the pharmacy for their refill is the quickest method to get the refill.  . Refill policy also stated that it will take anywhere between 24-72 hours to receive the refill.    Name of medication? Methimazole - patient said she never got it and asked if we could re send  Is this a 90 day supply? Patient unsure  Name and location of pharmacy?  [id number] - [place], [place] - [address] Phone:  [phone]  Fax:  [phone]

## 2019-09-24 ENCOUNTER — Other Ambulatory Visit: Payer: Self-pay

## 2019-09-24 MED ORDER — METHIMAZOLE 5 MG PO TABS: 5.0000 mg | ORAL_TABLET | Freq: Every day | ORAL | 1 refills | Status: AC

## 2019-09-24 NOTE — Telephone Encounter (Signed)
Spoke to pt and informed pt that [facility]. Pt stated that they had the wrong address because she no longer stayed there. I informed pt to contact pharmacy and have her address changed or her account closed since she did not want to use them any longer also informed her that I would have refill sent to local pharmacy.

## 2019-10-25 DIAGNOSIS — L600 Ingrowing nail: Secondary | ICD-10-CM | POA: Diagnosis not present

## 2019-10-27 DIAGNOSIS — F332 Major depressive disorder, recurrent severe without psychotic features: Secondary | ICD-10-CM | POA: Diagnosis not present

## 2019-11-03 DIAGNOSIS — F332 Major depressive disorder, recurrent severe without psychotic features: Secondary | ICD-10-CM | POA: Diagnosis not present

## 2019-11-04 DIAGNOSIS — M25774 Osteophyte, right foot: Secondary | ICD-10-CM | POA: Diagnosis not present

## 2019-11-04 DIAGNOSIS — L600 Ingrowing nail: Secondary | ICD-10-CM | POA: Diagnosis not present

## 2019-11-04 DIAGNOSIS — M25775 Osteophyte, left foot: Secondary | ICD-10-CM | POA: Diagnosis not present

## 2019-11-17 DIAGNOSIS — F332 Major depressive disorder, recurrent severe without psychotic features: Secondary | ICD-10-CM | POA: Diagnosis not present

## 2019-11-29 ENCOUNTER — Ambulatory Visit: Payer: BC Managed Care – PPO | Admitting: Internal Medicine

## 2019-11-29 NOTE — Progress Notes (Deleted)
Name: Nina [name] Nina [name]  MRN/ DOB: [id number], 05-16-1995    Age/ Sex: 24 y.o., female     PCP: Patient, No Pcp Per   Reason for Endocrinology Evaluation: Hyperthyroidism     Initial Endocrinology Clinic Visit: 05/20/2018    PATIENT IDENTIFIER: Nina [name] Nina [name] is a 24 y.o., female with a past medical history of Graves' disease. She has followed with [facility] since 05/21/2019 for consultative assistance with management of her hyperthyroidism.   HISTORICAL SUMMARY: The patient was first diagnosed with Graves' Disease at the age of 24 , she was put on a B-blocker and something else that she took for 6 months and was lost to follow up until she went to have a routine check up during her Gyn visit in February, 2020 and was found to have a TSH of 0.01 uIU/mL    Methimazole started 05/2018. Pt was lost to follow up and did not return until 07/2019 and was restarted on methimazole at the time   Maternal and paternal grandmothers with thyroid disease   SUBJECTIVE:    Today (11/29/2019):  Nina [name] Nina [name] is here for a follow up on hyperthyroidism, she has not been to our office in a year.  She has been off methimazole for months.      Methimazole 5 mg daily    ROS: As per HPI.   HISTORY:  Past Medical History:  Past Medical History:  Diagnosis Date  . Borderline personality disorder ([facility])   . LGSIL of cervix of undetermined significance 05/2018   On colposcopic biopsy  . Thyroid disease   . Vertigo     Past Surgical History: No past surgical history on file.  Social History:  reports that she has quit smoking. Her smoking use included cigarettes and e-cigarettes. She has never used smokeless tobacco. She reports current alcohol use. She reports current drug use. Drug: Marijuana. Family History:  Family History  Problem Relation Age of Onset  . Hypertension Paternal Grandmother   . Thyroid disease Paternal Grandmother   . Hypertension Paternal Grandfather   .  Thyroid disease Maternal Grandmother      HOME MEDICATIONS: Allergies as of 11/29/2019      Reactions   Azithromycin Rash      Medication List       Accurate as of November 29, 2019  7:46 AM. If you have any questions, ask your nurse or doctor.        amphetamine-dextroamphetamine 20 MG tablet Commonly known as: ADDERALL Take 20 mg by mouth 2 (two) times daily.   clonazePAM 0.5 MG tablet Commonly known as: KLONOPIN 0.5 mg.   divalproex 250 MG DR tablet Commonly known as: DEPAKOTE Take 250 mg by mouth 2 (two) times daily.   etonogestrel-ethinyl estradiol 0.12-0.015 MG/24HR vaginal ring Commonly known as: NuvaRing INSERT 1 RING VAGINALLY AS DIRECTED. REMOVE AFTER 3 WEEKS & WAIT 7 DAYS BEFORE INSERTING A NEW RING   ibuprofen 600 MG tablet Commonly known as: ADVIL Take 1 tablet (600 mg total) by mouth every 6 (six) hours as needed.   methimazole 5 MG tablet Commonly known as: TAPAZOLE Take 1 tablet (5 mg total) by mouth daily.         OBJECTIVE:   PHYSICAL EXAM: VS: There were no vitals taken for this visit.   EXAM: General: Pt appears well and is in NAD  Neck: General: Supple without adenopathy. Thyroid: Thyroid size normal.  No goiter or nodules appreciated. No thyroid bruit.  Lungs:  Clear with good BS bilat with no rales, rhonchi, or wheezes  Heart: Auscultation: RRR.  Abdomen: Normoactive bowel sounds, soft, nontender, without masses or organomegaly palpable  Extremities:  BL LE: No pretibial edema normal ROM and strength.  Skin: Hair: Texture and amount normal with gender appropriate distribution Skin Inspection: No rashes Skin Palpation: Skin temperature, texture, and thickness normal to palpation  Mental Status: Judgment, insight: Intact Orientation: Oriented to time, place, and person Memory: Intact for recent and remote events Mood and affect: No depression, anxiety, or agitation     DATA REVIEWED:  Results for Nina [name], Nina [name] (MRN [id number])  as of 08/27/2019 13:50  Ref. Range 08/27/2019 08:52  TSH Latest Ref Range: 0.35 - 4.50 uIU/mL <0.01 (L)  T4,Free(Direct) Latest Ref Range: 0.60 - 1.60 ng/dL 9.67     ASSESSMENT / PLAN / RECOMMENDATIONS:   1. Hyperthyroidism Secondary to Graves' Disease:   - She is clinically euthyroid - No local neck symptoms - She has not been on methimazole in months, given suppressed TSH on today's labs will restart methimazole as below   Medications  Methimazole 5 mg daily     2. Graves' Disease:   - No extrathyroidal manifestations of graves' disease    F/u in 3 months  Signed electronically by: Lyndle Herrlich, MD  [facility]  [facility] [address] [place] [place], [place] [address] Phone: [phone] FAX: [phone]      CC: Patient, No Pcp Per No address on file Phone: None  Fax: None   Return to Endocrinology clinic as below: Future Appointments  Date Time Provider Department Center  11/29/2019  9:10 AM Finley Chevez, Konrad Dolores, MD [facility] None

## 2019-12-01 DIAGNOSIS — F332 Major depressive disorder, recurrent severe without psychotic features: Secondary | ICD-10-CM | POA: Diagnosis not present

## 2019-12-03 DIAGNOSIS — K120 Recurrent oral aphthae: Secondary | ICD-10-CM | POA: Diagnosis not present

## 2019-12-07 DIAGNOSIS — L600 Ingrowing nail: Secondary | ICD-10-CM | POA: Diagnosis not present

## 2019-12-22 DIAGNOSIS — F332 Major depressive disorder, recurrent severe without psychotic features: Secondary | ICD-10-CM | POA: Diagnosis not present

## 2019-12-29 DIAGNOSIS — F332 Major depressive disorder, recurrent severe without psychotic features: Secondary | ICD-10-CM | POA: Diagnosis not present

## 2020-02-01 DIAGNOSIS — L03031 Cellulitis of right toe: Secondary | ICD-10-CM | POA: Diagnosis not present

## 2020-02-02 DIAGNOSIS — F332 Major depressive disorder, recurrent severe without psychotic features: Secondary | ICD-10-CM | POA: Diagnosis not present

## 2020-02-03 DIAGNOSIS — F332 Major depressive disorder, recurrent severe without psychotic features: Secondary | ICD-10-CM | POA: Diagnosis not present

## 2020-02-16 DIAGNOSIS — F332 Major depressive disorder, recurrent severe without psychotic features: Secondary | ICD-10-CM | POA: Diagnosis not present

## 2020-02-17 DIAGNOSIS — F332 Major depressive disorder, recurrent severe without psychotic features: Secondary | ICD-10-CM | POA: Diagnosis not present

## 2020-03-01 DIAGNOSIS — F332 Major depressive disorder, recurrent severe without psychotic features: Secondary | ICD-10-CM | POA: Diagnosis not present

## 2020-03-02 DIAGNOSIS — F332 Major depressive disorder, recurrent severe without psychotic features: Secondary | ICD-10-CM | POA: Diagnosis not present

## 2020-03-09 DIAGNOSIS — F332 Major depressive disorder, recurrent severe without psychotic features: Secondary | ICD-10-CM | POA: Diagnosis not present

## 2020-04-19 DIAGNOSIS — F332 Major depressive disorder, recurrent severe without psychotic features: Secondary | ICD-10-CM | POA: Diagnosis not present

## 2020-05-02 DIAGNOSIS — F332 Major depressive disorder, recurrent severe without psychotic features: Secondary | ICD-10-CM | POA: Diagnosis not present

## 2020-05-17 DIAGNOSIS — Z0000 Encounter for general adult medical examination without abnormal findings: Secondary | ICD-10-CM | POA: Diagnosis not present

## 2020-06-09 DIAGNOSIS — R0789 Other chest pain: Secondary | ICD-10-CM | POA: Diagnosis not present

## 2020-06-09 DIAGNOSIS — R457 State of emotional shock and stress, unspecified: Secondary | ICD-10-CM | POA: Diagnosis not present

## 2020-06-09 DIAGNOSIS — R079 Chest pain, unspecified: Secondary | ICD-10-CM | POA: Diagnosis not present

## 2020-07-13 ENCOUNTER — Emergency Department (HOSPITAL_BASED_OUTPATIENT_CLINIC_OR_DEPARTMENT_OTHER)
Admission: EM | Admit: 2020-07-13 | Discharge: 2020-07-13 | Disposition: A | Discharge status: Home / Self Care | Payer: BC Managed Care – PPO | Attending: Emergency Medicine | Admitting: Emergency Medicine

## 2020-07-13 ENCOUNTER — Other Ambulatory Visit: Payer: Self-pay

## 2020-07-13 ENCOUNTER — Encounter (HOSPITAL_BASED_OUTPATIENT_CLINIC_OR_DEPARTMENT_OTHER): Payer: Self-pay | Admitting: *Deleted

## 2020-07-13 DIAGNOSIS — R000 Tachycardia, unspecified: Secondary | ICD-10-CM | POA: Insufficient documentation

## 2020-07-13 DIAGNOSIS — N3091 Cystitis, unspecified with hematuria: Secondary | ICD-10-CM | POA: Insufficient documentation

## 2020-07-13 DIAGNOSIS — Z87891 Personal history of nicotine dependence: Secondary | ICD-10-CM | POA: Insufficient documentation

## 2020-07-13 LAB — URINALYSIS, MICROSCOPIC (REFLEX)

## 2020-07-13 LAB — URINALYSIS, ROUTINE W REFLEX MICROSCOPIC
Bilirubin Urine: NEGATIVE
Glucose, UA: NEGATIVE mg/dL
Ketones, ur: NEGATIVE mg/dL
Nitrite: NEGATIVE
Protein, ur: NEGATIVE mg/dL
Specific Gravity, Urine: 1.01 (ref 1.005–1.030)
pH: 7 (ref 5.0–8.0)

## 2020-07-13 LAB — PREGNANCY, URINE: Preg Test, Ur: NEGATIVE

## 2020-07-13 MED ORDER — CEPHALEXIN 500 MG PO CAPS: 500.0000 mg | ORAL_CAPSULE | Freq: Three times a day (TID) | ORAL | 0 refills | Status: AC

## 2020-07-13 NOTE — ED Provider Notes (Signed)
[facility] EMERGENCY DEPARTMENT Provider Note   CSN: [id number] Arrival date & time: 07/13/20  1707     History Chief Complaint  Patient presents with  . Hematuria    Nina [name] is a 25 y.o. female.  25yo F w/ PMH below who p/w hematuria and dysuria. Approx 1.5 weeks ago, pt had some vaginal bleeding while her Nuvaring was in place which was unusual but it resolved. She removed the ring and is no longer having bleeding and no discharge. A couple of days ago, she began having hematuria, burning w/ urination, and lower abdominal cramping with pain across low back. No unilateral pain, N/V, fevers, diarrhea, or URI symptoms. No hx of kidney stones. No meds PTA.   The history is provided by the patient.  Hematuria       Past Medical History:  Diagnosis Date  . Borderline personality disorder ([facility])   . LGSIL of cervix of undetermined significance 05/2018   On colposcopic biopsy  . Thyroid disease   . Vertigo     Patient Active Problem List   Diagnosis Date Noted  . Graves disease 08/12/2018  . Hyperthyroidism 08/12/2018  . History of depression 12/07/2012    History reviewed. No pertinent surgical history.   OB History    Gravida  0   Para  0   Term  0   Preterm  0   AB  0   Living  0     SAB  0   IAB  0   Ectopic  0   Multiple  0   Live Births  0           Family History  Problem Relation Age of Onset  . Hypertension Paternal Grandmother   . Thyroid disease Paternal Grandmother   . Hypertension Paternal Grandfather   . Thyroid disease Maternal Grandmother     Social History   Tobacco Use  . Smoking status: Former Smoker    Types: Cigarettes, E-cigarettes  . Smokeless tobacco: Never Used  Vaping Use  . Vaping Use: Every day  Substance Use Topics  . Alcohol use: Yes    Alcohol/week: 0.0 standard drinks    Comment: occassional  . Drug use: Yes    Types: Marijuana    Comment: previous use    Home Medications Prior  to Admission medications   Medication Sig Start Date End Date Taking? Authorizing Provider  cephALEXin (KEFLEX) 500 MG capsule Take 1 capsule (500 mg total) by mouth 3 (three) times daily for 7 days. 07/13/20 07/20/20 Yes Penny Arrambide, Ambrose Finland, MD  amphetamine-dextroamphetamine (ADDERALL) 20 MG tablet Take 20 mg by mouth 2 (two) times daily.    [provider]  clonazePAM (KLONOPIN) 0.5 MG tablet 0.5 mg. 05/19/18   [provider]  divalproex (DEPAKOTE) 250 MG DR tablet Take 250 mg by mouth 2 (two) times daily.    [provider]  etonogestrel-ethinyl estradiol (NUVARING) 0.12-0.015 MG/24HR vaginal ring INSERT 1 RING VAGINALLY AS DIRECTED. REMOVE AFTER 3 WEEKS & WAIT 7 DAYS BEFORE INSERTING A NEW RING 06/23/19   Harrington Challenger, NP  ibuprofen (ADVIL,MOTRIN) 600 MG tablet Take 1 tablet (600 mg total) by mouth every 6 (six) hours as needed. 04/10/18   Elpidio Anis, PA-C  methimazole (TAPAZOLE) 5 MG tablet Take 1 tablet (5 mg total) by mouth daily. 09/24/19   Shamleffer, Konrad Dolores, MD    Allergies    Azithromycin  Review of Systems   Review of Systems  Genitourinary: Positive for hematuria.   All other systems reviewed and are negative except that which was mentioned in HPI  Physical Exam Updated Vital Signs BP 140/77 (BP Location: Left Arm)   Pulse (!) 122   Temp 98.4 F (36.9 C) (Oral)   Resp 18   Ht 5\' 1"  (1.549 m)   Wt 67.6 kg   LMP 07/06/2020   SpO2 100%   BMI 28.15 kg/m   Physical Exam Vitals and nursing note reviewed.  Constitutional:      General: She is not in acute distress.    Appearance: She is well-developed.  HENT:     Head: Normocephalic and atraumatic.  Eyes:     Conjunctiva/sclera: Conjunctivae normal.  Pulmonary:     Effort: Pulmonary effort is normal.  Abdominal:     General: Abdomen is flat. Bowel sounds are normal. There is no distension.     Palpations: Abdomen is soft.     Tenderness: There is no abdominal tenderness.   Musculoskeletal:     Cervical back: Neck supple.  Skin:    General: Skin is warm and dry.  Neurological:     Mental Status: She is alert and oriented to person, place, and time.  Psychiatric:        Judgment: Judgment normal.     ED Results / Procedures / Treatments   Labs (all labs ordered are listed, but only abnormal results are displayed) Labs Reviewed  URINALYSIS, ROUTINE W REFLEX MICROSCOPIC - Abnormal; Notable for the following components:      Result Value   Hgb urine dipstick MODERATE (*)    Leukocytes,Ua MODERATE (*)    All other components within normal limits  URINALYSIS, MICROSCOPIC (REFLEX) - Abnormal; Notable for the following components:   Bacteria, UA RARE (*)    All other components within normal limits  URINE CULTURE  PREGNANCY, URINE    EKG None  Radiology No results found.  Procedures Procedures   Medications Ordered in ED Medications - No data to display  ED Course  I have reviewed the triage vital signs and the nursing notes.  Pertinent labs  that were available during my care of the patient were reviewed by me and considered in my medical decision making (see chart for details).    MDM Rules/Calculators/A&P                          Well-appearing on exam, tachycardic but mildly anxious.  Afebrile.  No abdominal tenderness.  UA with moderate leukocytes, some RBCs and WBCs with rare bacteria.  I suspect she has hemorrhagic cystitis.  She has no unilateral pain or flank pain to suggest kidney stone.  Because of her well appearance, I do not feel she needs lab work or imaging at this time.  Have discussed treatment with antibiotic course and supportive measures.  Reviewed return precautions and she voiced understanding. Final Clinical Impression(s) / ED Diagnoses Final diagnoses:  Hemorrhagic cystitis    Rx / DC Orders ED Discharge Orders         Ordered    cephALEXin (KEFLEX) 500 MG capsule  3 times daily        07/13/20 1801            Adilynn Bessey, 07/15/20, MD 07/13/20 2005

## 2020-07-13 NOTE — ED Triage Notes (Signed)
Dysuria, hematuria, back pain for a 6 weeks.

## 2020-07-15 LAB — URINE CULTURE

## 2020-08-21 ENCOUNTER — Other Ambulatory Visit: Payer: Self-pay

## 2020-08-21 DIAGNOSIS — Z3044 Encounter for surveillance of vaginal ring hormonal contraceptive device: Secondary | ICD-10-CM

## 2020-08-22 ENCOUNTER — Telehealth: Payer: Self-pay

## 2020-08-22 DIAGNOSIS — Z3044 Encounter for surveillance of vaginal ring hormonal contraceptive device: Secondary | ICD-10-CM

## 2020-08-22 NOTE — Telephone Encounter (Signed)
Medication refill request: Nuvaring Last AEX:  06-23-19 Next AEX: none Last MMG (if hormonal medication request): n/a Refill authorized: REFUSEd--needs appt.

## 2020-08-28 ENCOUNTER — Other Ambulatory Visit: Payer: Self-pay

## 2020-08-28 ENCOUNTER — Encounter (HOSPITAL_COMMUNITY): Payer: Self-pay | Admitting: Emergency Medicine

## 2020-08-28 ENCOUNTER — Emergency Department (HOSPITAL_COMMUNITY)
Admission: EM | Admit: 2020-08-28 | Discharge: 2020-08-28 | Disposition: A | Discharge status: Home / Self Care | Payer: Self-pay | Attending: Emergency Medicine | Admitting: Emergency Medicine

## 2020-08-28 ENCOUNTER — Emergency Department (HOSPITAL_COMMUNITY): Payer: Self-pay

## 2020-08-28 DIAGNOSIS — M79601 Pain in right arm: Secondary | ICD-10-CM

## 2020-08-28 DIAGNOSIS — W19XXXA Unspecified fall, initial encounter: Secondary | ICD-10-CM

## 2020-08-28 DIAGNOSIS — S80212A Abrasion, left knee, initial encounter: Secondary | ICD-10-CM | POA: Insufficient documentation

## 2020-08-28 DIAGNOSIS — W108XXA Fall (on) (from) other stairs and steps, initial encounter: Secondary | ICD-10-CM | POA: Insufficient documentation

## 2020-08-28 DIAGNOSIS — Y92009 Unspecified place in unspecified non-institutional (private) residence as the place of occurrence of the external cause: Secondary | ICD-10-CM | POA: Insufficient documentation

## 2020-08-28 DIAGNOSIS — M79675 Pain in left toe(s): Secondary | ICD-10-CM | POA: Insufficient documentation

## 2020-08-28 DIAGNOSIS — M25562 Pain in left knee: Secondary | ICD-10-CM

## 2020-08-28 DIAGNOSIS — S5011XA Contusion of right forearm, initial encounter: Secondary | ICD-10-CM | POA: Insufficient documentation

## 2020-08-28 DIAGNOSIS — S0990XA Unspecified injury of head, initial encounter: Secondary | ICD-10-CM | POA: Insufficient documentation

## 2020-08-28 DIAGNOSIS — Z87891 Personal history of nicotine dependence: Secondary | ICD-10-CM | POA: Insufficient documentation

## 2020-08-28 NOTE — Discharge Instructions (Addendum)
Your xrays did not show any fractures at this time. Please wear post op shoe as indicated given significant pain to your great toe with ambulating.   While at home please rest, ice, and elevate your right forearm and left leg to help reduce pain/swelling. You can take Ibuprofen and Tylenol as needed for pain.   Your headache is likely related to a concussion. Please see attached information on concussions. It is very important to allow for brain rest. You can do this by sitting in a darkened room whenever possible and avoiding bright lights from TV, cellphones, computers, etc.   Follow up with your PCP regarding your ED visit today  Return to the ED for any new/worsening symptoms

## 2020-08-28 NOTE — ED Provider Notes (Signed)
Emergency Medicine Provider Triage Evaluation Note  Nina [name] , a 25 y.o. female  was evaluated in triage.  Pt complains of fall.  Patient states that she was at a guy's house, and was told the wrong door to the bathroom and fell down 20 flights of steps.  She states she did hit her head, but did not lose consciousness.  She complains of right arm pain, left toe pain and left knee pain.  She has pain with putting pressure on her left knee.  She endorses a headache, but no vision changes, no nausea, vomiting.  She stated that she did hit her head twice in the last 48 hours, the first time being on a marble table.  She is not on any anticoagulation.  Review of Systems  Positive: As above Negative: As above  Physical Exam  BP (!) 130/95   Pulse (!) 112   Temp 98.5 F (36.9 C) (Oral)   Resp 18   Ht 5\' 1"  (1.549 m)   Wt 68 kg   SpO2 99%   BMI 28.34 kg/m  Gen:   Awake, no distress   Resp:  Normal effort  MSK:   Right forearm with visible bruising, tender to palpation.  2+ radial pulses.  Able to move all 5 digits.  Left knee with visible bruising, no tibial plateau tenderness.  Limited flexion and extension of the left knee secondary to pain.  No visible deformity to the left great toe.  No midline cervical tenderness, full range of motion of neck.  No step-offs or crepitus.  No visible cranial injuries, no raccoon eyes, no battle sign.  Medical Decision Making  Medically screening exam initiated at 3:44 PM.  Appropriate orders placed.  LORECE KEACH was informed that the remainder of the evaluation will be completed by another provider, this initial triage assessment does not replace that evaluation, and the importance of remaining in the ED until their evaluation is complete.    Barnetta Chapel, PA-C 08/28/20 1548    08/30/20, DO 08/28/20 2152

## 2020-08-28 NOTE — ED Triage Notes (Signed)
Pt states that she fell down a flight of stairs last night, hit her head, bruised her right arm, bruised the back of her right thigh, hurt her left great toe. Pt denies LOC. Pt is A&Ox4.

## 2020-08-28 NOTE — ED Provider Notes (Signed)
[facility]-EMERGENCY DEPT Provider Note   CSN: [id number] Arrival date & time: 08/28/20  1513     History Chief Complaint  Patient presents with  . Fall  . Head Injury    Nina [name] is a 25 y.o. female who presents to the ED today with complaint of fall down approximately 20 carpeted steps last night. Pt reports she was at a guy's house for the first time. She reports opening up a door thinking it was the door to the bathroom and stepping inside only to then proceed to fall down the entire set of stairs. She does admit to being intoxicated. She reports she hit her head at the bottom of the staircase however denies LOC. She is not anticoagulated. She currently complains of a headache, right forearm pain, left knee pain, and left great toe pain. She denies hx of concussions in the past. She denies nausea or vomiting, vision changes, confusion. She has been taking Ibuprofen with mild relief. No other complaints at this time.   The history is provided by the patient and medical records.       Past Medical History:  Diagnosis Date  . Borderline personality disorder (HCC)   . LGSIL of cervix of undetermined significance 05/2018   On colposcopic biopsy  . Thyroid disease   . Vertigo     Patient Active Problem List   Diagnosis Date Noted  . Graves disease 08/12/2018  . Hyperthyroidism 08/12/2018  . History of depression 12/07/2012    History reviewed. No pertinent surgical history.   OB History    Gravida  0   Para  0   Term  0   Preterm  0   AB  0   Living  0     SAB  0   IAB  0   Ectopic  0   Multiple  0   Live Births  0           Family History  Problem Relation Age of Onset  . Hypertension Paternal Grandmother   . Thyroid disease Paternal Grandmother   . Hypertension Paternal Grandfather   . Thyroid disease Maternal Grandmother     Social History   Tobacco Use  . Smoking status: Former Smoker    Types: Cigarettes,  E-cigarettes  . Smokeless tobacco: Never Used  Vaping Use  . Vaping Use: Every day  Substance Use Topics  . Alcohol use: Yes    Alcohol/week: 0.0 standard drinks    Comment: occassional  . Drug use: Yes    Types: Marijuana    Comment: previous use    Home Medications Prior to Admission medications   Medication Sig Start Date End Date Taking? Authorizing Provider  amphetamine-dextroamphetamine (ADDERALL) 20 MG tablet Take 20 mg by mouth 2 (two) times daily.    [provider]  clonazePAM (KLONOPIN) 0.5 MG tablet 0.5 mg. 05/19/18   [provider]  divalproex (DEPAKOTE) 250 MG DR tablet Take 250 mg by mouth 2 (two) times daily.    [provider]  etonogestrel-ethinyl estradiol (NUVARING) 0.12-0.015 MG/24HR vaginal ring INSERT 1 RING VAGINALLY AS DIRECTED. REMOVE AFTER 3 WEEKS & WAIT 7 DAYS BEFORE INSERTING A NEW RING 06/23/19   Harrington Challenger, NP  ibuprofen (ADVIL,MOTRIN) 600 MG tablet Take 1 tablet (600 mg total) by mouth every 6 (six) hours as needed. 04/10/18   Elpidio Anis, PA-C  methimazole (TAPAZOLE) 5 MG tablet Take 1 tablet (5 mg total) by mouth daily.  09/24/19   Shamleffer, Konrad Dolores, MD    Allergies    Azithromycin  Review of Systems   Review of Systems  Constitutional: Negative for chills and fever.  Musculoskeletal: Positive for arthralgias.  Neurological: Positive for headaches. Negative for dizziness, syncope, weakness, light-headedness and numbness.  Psychiatric/Behavioral: Negative for confusion.  All other systems reviewed and are negative.   Physical Exam Updated Vital Signs BP (!) 130/95   Pulse (!) 112   Temp 98.5 F (36.9 C) (Oral)   Resp 18   Ht 5\' 1"  (1.549 m)   Wt 68 kg   SpO2 99%   BMI 28.34 kg/m   Physical Exam Vitals and nursing note reviewed.  Constitutional:      Appearance: She is not ill-appearing or diaphoretic.  HENT:     Head: Normocephalic and atraumatic.     Comments: No raccoon's sign or battle's  sign. Negative hemotympanum bilaterally.     Right Ear: Tympanic membrane normal.     Left Ear: Tympanic membrane normal.  Eyes:     Extraocular Movements: Extraocular movements intact.     Conjunctiva/sclera: Conjunctivae normal.     Pupils: Pupils are equal, round, and reactive to light.  Neck:     Comments: No c midline spinal TTP Cardiovascular:     Rate and Rhythm: Normal rate and regular rhythm.     Pulses: Normal pulses.  Pulmonary:     Effort: Pulmonary effort is normal.     Breath sounds: Normal breath sounds. No wheezing, rhonchi or rales.  Abdominal:     Palpations: Abdomen is soft.     Tenderness: There is no abdominal tenderness. There is no guarding or rebound.  Musculoskeletal:     Cervical back: Neck supple. No tenderness.     Comments: + skin abrasion and ecchymosis to mid right forearm with swelling and TTP. No tenderness to wrist or elbow. ROM intact to shoulder, elbow, and wrist. 2+ radial pulse. Able to wiggle finger without difficulty. Cap refill < 2 seconds.   + skin abrasion and ecchymosis to left patella with TTP. ROM limited to the knee s/2 pain. Negative anterior and posterior drawer test. No varus or valgus laxity. 2+ DP pulse.   + mild swelling and TTP to the left great toe. ROM limited s/2 pain. Cap refill intact.   Skin:    General: Skin is warm and dry.     Coloration: Skin is not jaundiced.  Neurological:     Mental Status: She is alert.     Comments: Alert and oriented to self, place, time and event.   Speech is fluent, clear without dysarthria or dysphasia.   Strength 5/5 in upper/lower extremities  Sensation intact in upper/lower extremities   Normal gait.  Negative Romberg. No pronator drift.  Normal finger-to-nose and feet tapping.  CN I not tested  CN II grossly intact visual fields bilaterally. Did not visualize posterior eye.   CN III, IV, VI PERRLA and EOMs intact bilaterally  CN V Intact sensation to sharp and light touch to the  face  CN VII facial movements symmetric  CN VIII not tested  CN IX, X no uvula deviation, symmetric rise of soft palate  CN XI 5/5 SCM and trapezius strength bilaterally  CN XII Midline tongue protrusion, symmetric L/R movements      ED Results / Procedures / Treatments   Labs (all labs ordered are listed, but only abnormal results are displayed) Labs Reviewed - No data to display  EKG None  Radiology DG Forearm Right  Result Date: 08/28/2020 CLINICAL DATA:  Status post fall with pain. EXAM: RIGHT FOREARM - 2 VIEW COMPARISON:  None. FINDINGS: There is no evidence of fracture or other focal bone lesions. Dorsal soft tissue edema. IMPRESSION: 1. No acute fracture or dislocation identified about the right forearm. 2. Dorsal soft tissue edema. Electronically Signed   By: Ted Mcalpineobrinka  Dimitrova M.D.   On: 08/28/2020 16:22   DG Knee Complete 4 Views Left  Result Date: 08/28/2020 CLINICAL DATA:  Status post fall 1 day ago.  Pain. EXAM: LEFT KNEE - COMPLETE 4+ VIEW COMPARISON:  None. FINDINGS: No acute fracture or dislocation. No aggressive osseous lesion. Normal alignment. No joint effusion. Soft tissue are unremarkable. No radiopaque foreign body or soft tissue emphysema. IMPRESSION: No acute osseous injury of the left knee. Electronically Signed   By: Elige KoHetal  Patel   On: 08/28/2020 16:22   DG Toe Great Left  Result Date: 08/28/2020 CLINICAL DATA:  Status post fall 1 day ago.  Great toe pain. EXAM: LEFT GREAT TOE COMPARISON:  None. FINDINGS: No acute fracture or dislocation. No aggressive osseous lesion. Normal alignment. Tiny marginal osteophytes along the lateral aspect of the first MTP joint. First MTP joint space is relatively well maintained. Soft tissue are unremarkable. No radiopaque foreign body or soft tissue emphysema. IMPRESSION: No acute osseous injury of the left great toe. Electronically Signed   By: Elige KoHetal  Patel   On: 08/28/2020 16:23    Procedures Procedures   Medications  Ordered in ED Medications - No data to display  ED Course  I have reviewed the triage vital signs and the nursing notes.  Pertinent labs & imaging results that were available during my care of the patient were reviewed by me and considered in my medical decision making (see chart for details).  Clinical Course as of 08/28/20 1656  Mon Aug 28, 2020  1627 DG Forearm Right [MV]    Clinical Course User Index [MV] Tanda RockersVenter, Zerenity Bowron, [place]PA-C   MDM Rules/Calculators/A&P                          25 year old female who presents to the ED today after falling down approximately 20 carpeted steps last night while drinking.  Did hit head at the bottom of the staircase however did not lose consciousness.  Currently complaining of a headache, right forearm pain, left knee pain, left great toe pain.  On arrival to the ED patient is afebrile, nontachypneic, appears to be in no acute distress.  She is tachycardic on arrival however this is dissipated while she is in the room.  Suspect likely secondary to pain initially.  She was medically screened and x-rays were obtained including the right forearm, left knee, left great toe without any obvious abnormalities.  He is currently complaining of a headache, is not anticoagulated.  Her focal neuro exam is intact without any abnormalities.  Likely suspect concussion at this time.  Denies history of same.  Have instructed brain rest and ibuprofen/Tylenol as needed for pain.  Patient is requesting something more specifically for her left great toe as it is significantly painful.  She states it hurts when she ambulates.  Have offered crutches however patient declines.  I think a postop shoe would help her put more weight on other portions of her foot with ambulation.  Will order at this time.  Patient instructed on rice therapy at home and PCP follow-up.  She is in agreement with plan.  This note was prepared using Dragon voice recognition software and may include unintentional  dictation errors due to the inherent limitations of voice recognition software.  Final Clinical Impression(s) / ED Diagnoses Final diagnoses:  Fall, initial encounter  Minor head injury, initial encounter  Pain of right upper extremity  Acute pain of left knee  Great toe pain, left    Rx / DC Orders ED Discharge Orders    None       Discharge Instructions     Your xrays did not show any fractures at this time. Please wear post op shoe as indicated given significant pain to your great toe with ambulating.   While at home please rest, ice, and elevate your right forearm and left leg to help reduce pain/swelling. You can take Ibuprofen and Tylenol as needed for pain.   Your headache is likely related to a concussion. Please see attached information on concussions. It is very important to allow for brain rest. You can do this by sitting in a darkened room whenever possible and avoiding bright lights from TV, cellphones, computers, etc.   Follow up with your PCP regarding your ED visit today  Return to the ED for any new/worsening symptoms        Tanda Rockers, PA-C 08/28/20 1657    Benjiman Core, MD 08/29/20 1452

## 2020-08-28 NOTE — Progress Notes (Signed)
Orthopedic Tech Progress Note Patient Details:  Nina [name] 07-Jul-1995 [id number]  Ortho Devices Type of Ortho Device: Postop shoe/boot Ortho Device/Splint Location: Left Foot Ortho Device/Splint Interventions: Application   Post Interventions Patient Tolerated: Well   [name] 08/28/2020, 5:08 PM

## 2023-02-06 IMAGING — CR DG TOE GREAT 2+V*L*
3 series · 3 of 3 positions shown · non-contrast
Comparison: None.

CLINICAL DATA: Status post fall 1 day ago.  Great toe pain.

EXAM:
LEFT GREAT TOE

[x toes ap left]
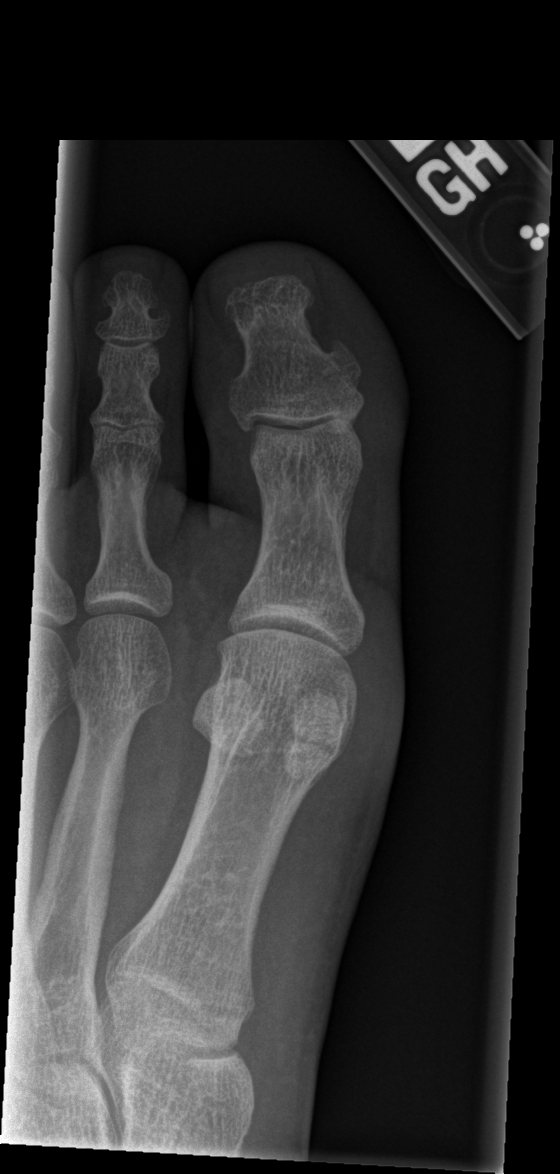

[x toes obl left]
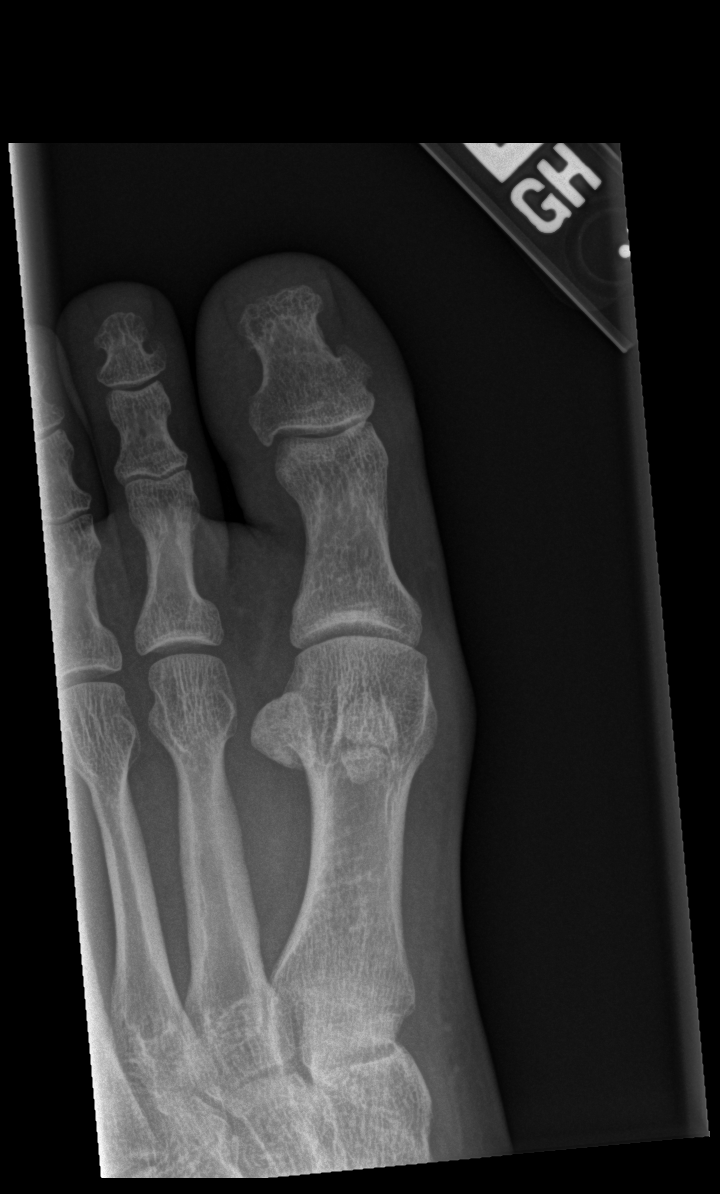

[x toes lat left]
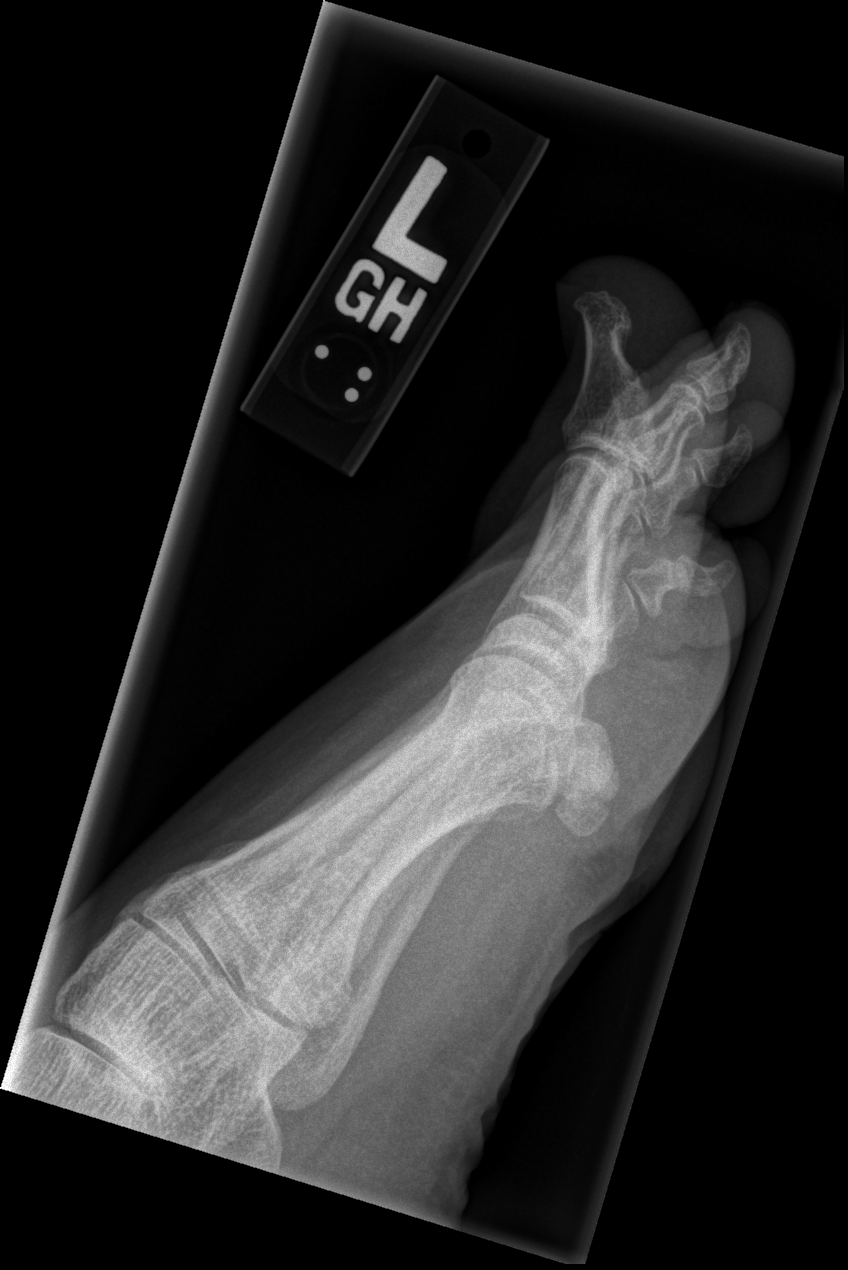

[3 of 3 positions shown; findings below may reference images not displayed]

FINDINGS: No acute fracture or dislocation. No aggressive osseous lesion.
Normal alignment. Tiny marginal osteophytes along the lateral aspect
of the first MTP joint. First MTP joint space is relatively well
maintained.

Soft tissue are unremarkable. No radiopaque foreign body or soft
tissue emphysema.
IMPRESSION: No acute osseous injury of the left great toe.

## 2023-02-06 IMAGING — CR DG FOREARM 2V*R*
2 series · 2 of 2 positions shown · non-contrast
Comparison: None.

CLINICAL DATA: Status post fall with pain.

EXAM:
RIGHT FOREARM - 2 VIEW

[x toes ap left]
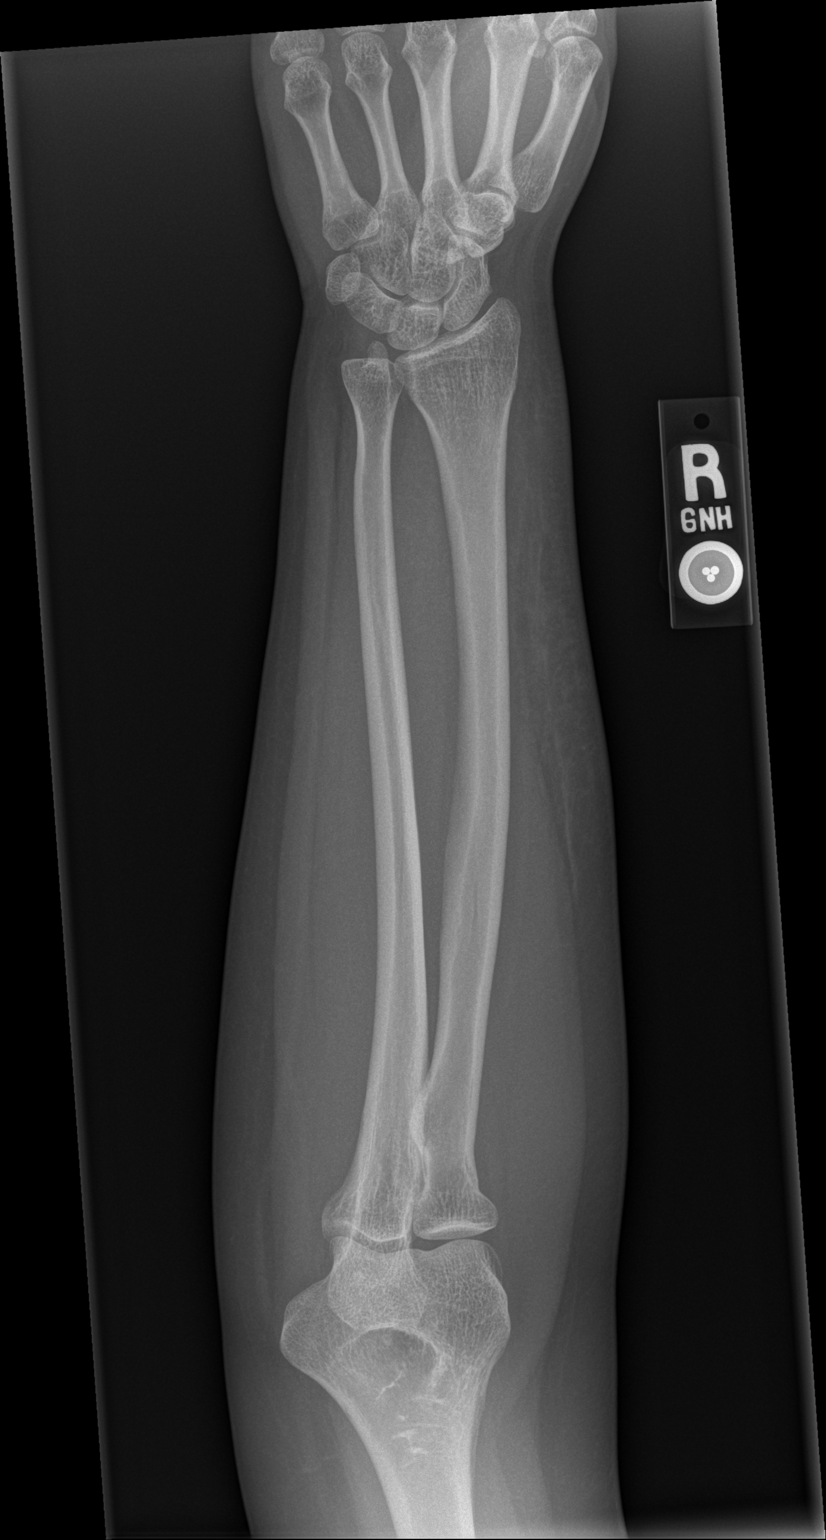

[x forearm lat right]
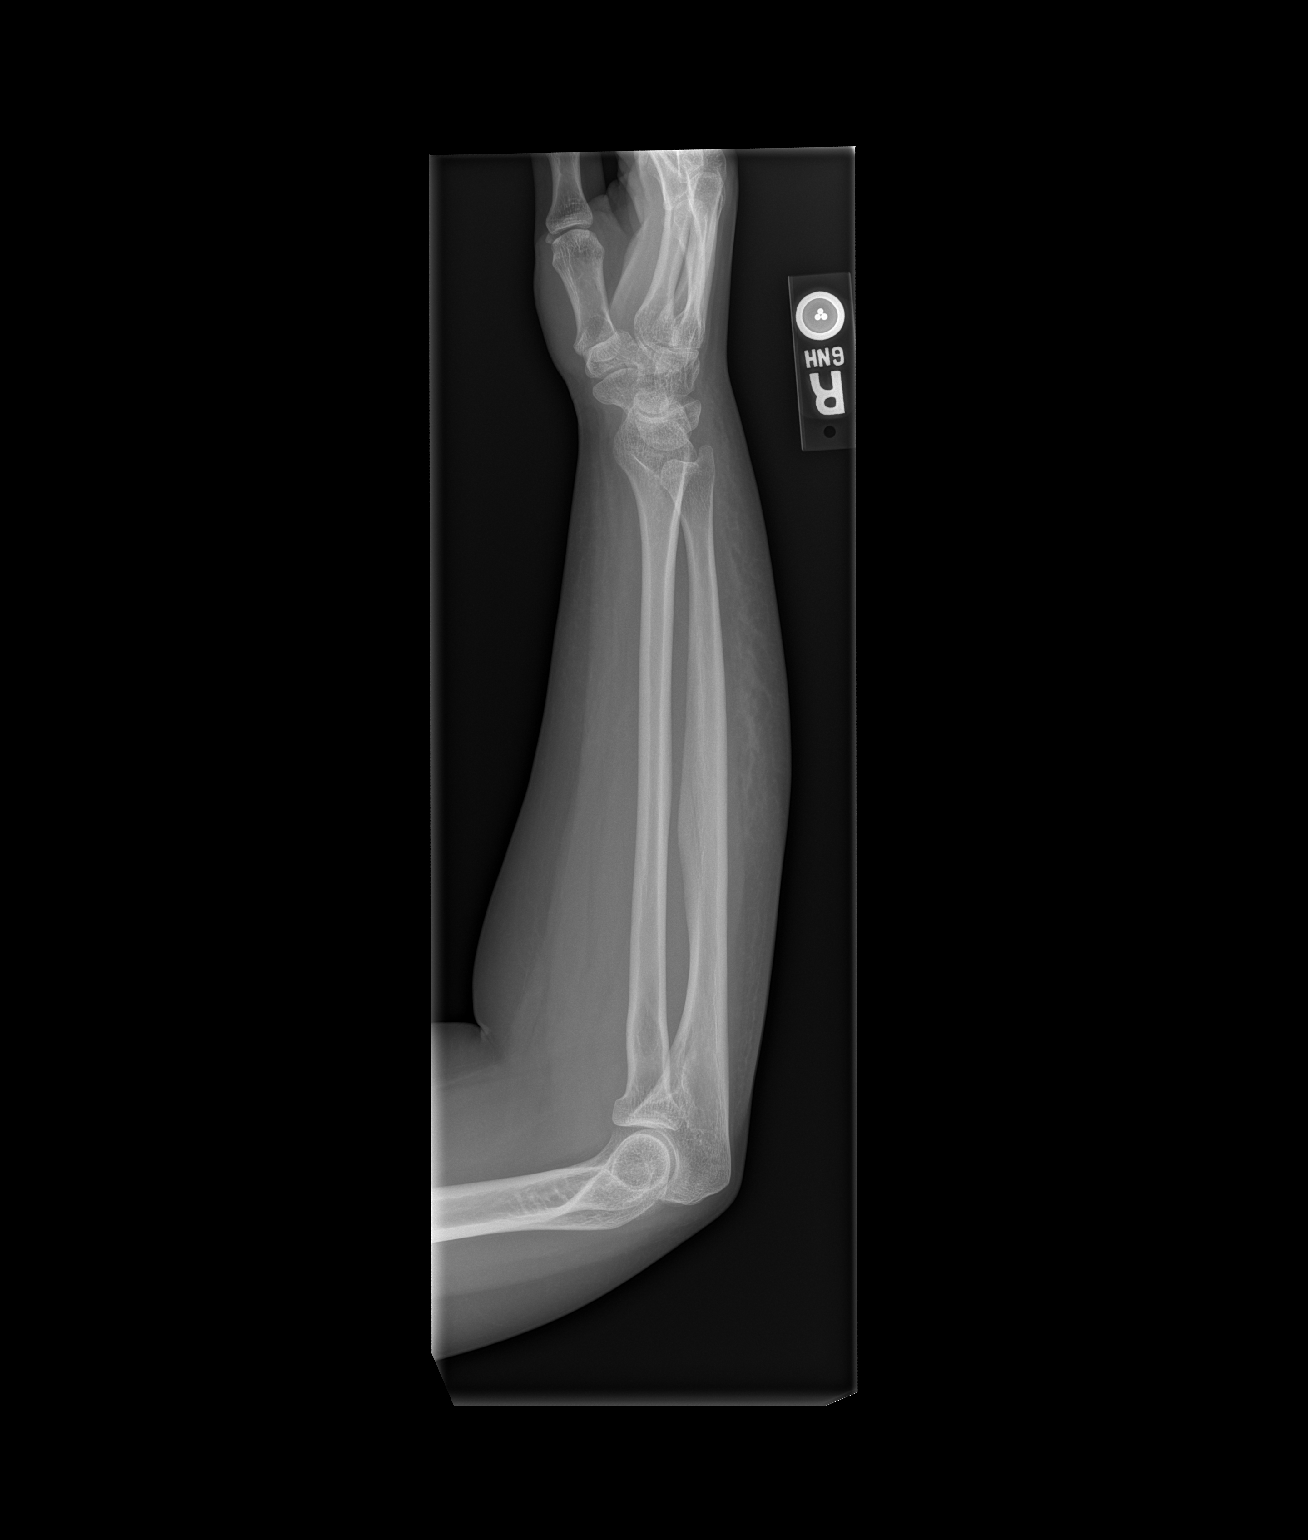

[2 of 2 positions shown; findings below may reference images not displayed]

FINDINGS: There is no evidence of fracture or other focal bone lesions. Dorsal
soft tissue edema.
IMPRESSION: 1. No acute fracture or dislocation identified about the right
forearm.
2. Dorsal soft tissue edema.
